# Patient Record
Sex: Male | Born: 1990 | Race: White | Hispanic: No | Marital: Single | Smoking: Never smoker
Health system: Southern US, Community
[De-identification: ages and names within clinical notes are randomized; demographics above are authoritative.]

## PROBLEM LIST (undated history)

## (undated) DIAGNOSIS — E785 Hyperlipidemia, unspecified: Secondary | ICD-10-CM

## (undated) DIAGNOSIS — R7303 Prediabetes: Secondary | ICD-10-CM

---
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
## 2011-10-19 ENCOUNTER — Emergency Department: Payer: Self-pay | Admitting: Emergency Medicine

## 2013-09-06 ENCOUNTER — Emergency Department: Payer: Self-pay | Admitting: Emergency Medicine

## 2013-10-22 ENCOUNTER — Emergency Department: Payer: Self-pay | Admitting: Emergency Medicine

## 2013-10-25 ENCOUNTER — Emergency Department: Payer: Self-pay | Admitting: Emergency Medicine

## 2013-10-25 LAB — CBC
HCT: 45 % (ref 40.0–52.0)
HGB: 15.1 g/dL (ref 13.0–18.0)
MCH: 28 pg (ref 26.0–34.0)
MCHC: 33.5 g/dL (ref 32.0–36.0)
Platelet: 227 10*3/uL (ref 150–440)
RBC: 5.39 10*6/uL (ref 4.40–5.90)
WBC: 11.3 10*3/uL — ABNORMAL HIGH (ref 3.8–10.6)

## 2013-10-25 LAB — BASIC METABOLIC PANEL
Anion Gap: 6 — ABNORMAL LOW (ref 7–16)
BUN: 19 mg/dL — ABNORMAL HIGH (ref 7–18)
Chloride: 104 mmol/L (ref 98–107)
Co2: 27 mmol/L (ref 21–32)
EGFR (Non-African Amer.): >60
Glucose: 95 mg/dL (ref 65–99)
Osmolality: 276 (ref 275–301)
Potassium: 3.4 mmol/L — ABNORMAL LOW (ref 3.5–5.1)
Sodium: 137 mmol/L (ref 136–145)

## 2013-11-01 LAB — COMPREHENSIVE METABOLIC PANEL
Albumin: 3.8 g/dL (ref 3.4–5.0)
Alkaline Phosphatase: 108 U/L
BUN: 13 mg/dL (ref 7–18)
Bilirubin,Total: 0.2 mg/dL (ref 0.2–1.0)
Calcium, Total: 9 mg/dL (ref 8.5–10.1)
Co2: 28 mmol/L (ref 21–32)
EGFR (African American): >60
EGFR (Non-African Amer.): >60
Osmolality: 278 (ref 275–301)
SGOT(AST): 29 U/L (ref 15–37)
SGPT (ALT): 36 U/L (ref 12–78)
Sodium: 139 mmol/L (ref 136–145)

## 2013-11-01 LAB — CBC WITH DIFFERENTIAL/PLATELET
Basophil #: 0.1 10*3/uL (ref 0.0–0.1)
Basophil %: 0.9 %
Eosinophil #: 0.5 10*3/uL (ref 0.0–0.7)
HCT: 46.5 % (ref 40.0–52.0)
HGB: 15.6 g/dL (ref 13.0–18.0)
Lymphocyte %: 26.4 %
MCV: 83 fL (ref 80–100)
Monocyte %: 9.8 %
Neutrophil #: 6.7 10*3/uL — ABNORMAL HIGH (ref 1.4–6.5)
Neutrophil %: 58.7 %
Platelet: 269 10*3/uL (ref 150–440)
RDW: 12.6 % (ref 11.5–14.5)

## 2013-11-01 LAB — URINALYSIS, COMPLETE
Glucose,UR: NEGATIVE mg/dL (ref 0–75)
Protein: 30
RBC,UR: 3 /HPF (ref 0–5)
Squamous Epithelial: NONE SEEN
WBC UR: 7 /HPF (ref 0–5)

## 2013-11-02 ENCOUNTER — Inpatient Hospital Stay: Payer: Self-pay | Admitting: Internal Medicine

## 2013-11-02 DIAGNOSIS — R0602 Shortness of breath: Secondary | ICD-10-CM

## 2013-11-02 LAB — PRO B NATRIURETIC PEPTIDE: B-Type Natriuretic Peptide: 137 pg/mL — ABNORMAL HIGH (ref 0–125)

## 2013-11-03 LAB — CBC WITH DIFFERENTIAL/PLATELET
Basophil %: 0.1 %
Eosinophil #: 0 10*3/uL (ref 0.0–0.7)
Eosinophil %: 0 %
HCT: 41.7 % (ref 40.0–52.0)
HGB: 14.1 g/dL (ref 13.0–18.0)
MCH: 28.4 pg (ref 26.0–34.0)
MCHC: 33.9 g/dL (ref 32.0–36.0)
MCV: 84 fL (ref 80–100)
Neutrophil #: 20.6 10*3/uL — ABNORMAL HIGH (ref 1.4–6.5)
Neutrophil %: 90.2 %
Platelet: 237 10*3/uL (ref 150–440)
RBC: 4.98 10*6/uL (ref 4.40–5.90)
RDW: 13.2 % (ref 11.5–14.5)
WBC: 22.9 10*3/uL — ABNORMAL HIGH (ref 3.8–10.6)

## 2013-11-03 LAB — BASIC METABOLIC PANEL
BUN: 11 mg/dL (ref 7–18)
Chloride: 108 mmol/L — ABNORMAL HIGH (ref 98–107)
Creatinine: 0.81 mg/dL (ref 0.60–1.30)
EGFR (African American): >60
EGFR (Non-African Amer.): >60
Potassium: 4 mmol/L (ref 3.5–5.1)

## 2013-11-03 LAB — MAGNESIUM: Magnesium: 2 mg/dL

## 2014-02-03 ENCOUNTER — Emergency Department: Payer: Self-pay | Admitting: Emergency Medicine

## 2014-12-03 ENCOUNTER — Emergency Department: Payer: Self-pay | Admitting: Internal Medicine

## 2015-03-02 NOTE — H&P (Signed)
PATIENT NAME:  Erik Tyler, Erik Tyler MR#:  [id number] DATE OF BIRTH:  Feb 12, 1991  DATE OF ADMISSION:  11/02/2013  REFERRING PHYSICIAN: Dr. Zenda Alpers.   PRIMARY CARE PHYSICIAN: None.  CHIEF COMPLAINT: Shortness of breath and wheezing.   HISTORY OF PRESENT ILLNESS: This is a very nice 24 year old gentleman who has history of being diagnosed with pneumonia 1-1/2 weeks ago here at the ER. He presented with a history of several days of cough and wheezing. He had a chest x-ray that showed significant changes that were likely secondary to infiltrate on the left base. The patient was given several medications and started having significant nausea, for which he came over and his medications were changed. He continued to take Levaquin, but his shortness of breath and cough continued to get worse. Apparently, he has been coughing all day up until the last two days he had a dry cough, now is productive with green sputum. The patient works for [employer] and he does work outside. He gets really winded with just minimal ambulation. If he picks up a box that could be 10 pounds or less, gets even more short of breath. Apparently, the patient has been breathing really hard, and has some soreness of the chest with deep breaths, especially in the retrosternal area. The patient comes to the Emergency Department. He is evaluated. He is given multiple nebulizations and he is not getting any better. He is breathing up to 30 minutes. His oxygen saturation dropped to 90 on room air and on the ABG we can see that he has a pO2 of 60. The patient was given one dose of Solu-Medrol and still has not had any significant improvement. The patient is admitted for evaluation of this symptoms. A CT scan of the chest was done to rule out pulmonary embolism and is negative for PE, only showed significant thickening peribronchial, which is unspecific at this moment, but it did not show any active consolidations. The patient says that he has  had fever at home, but he is afebrile over here.   REVIEW OF SYSTEMS:  CONSTITUTIONAL: No fever, as mentioned above. Positive fatigue. Positive weakness due to shortness of breath. No significant weight loss or weight gain.  EYES: No double vision, blurry vision or glaucoma.  EARS, NOSE, THROAT: No difficulty swallowing. No tinnitus.   RESPIRATORY: Positive cough. Positive wheezing. Positive dyspnea. Positive painful respirations. He used to smoke but he quit a couple of weeks ago. He never smoked more than 1 or 2 cigarettes a day and he did not smoke every day.  GASTROINTESTINAL: No nausea, vomiting. No abdominal pain, constipation or diarrhea.  GENITOURINARY: No dysuria or hematuria. No prostatitis.  ENDOCRINE: No polyuria, polydipsia, polyphagia, cold or heat intolerance.  SKIN: No rashes, petechiae, or new lesions.  LYMPHATIC: Negative for lymphadenopathy.  MUSCULOSKELETAL: Negative for significant joint abnormalities or joint swelling.  NEUROLOGIC: Cranial nerves II through XII are intact. No CVA, TIA, numbness or tingling.  PSYCHIATRIC: No significant insomnia or agitation.   PAST MEDICAL HISTORY: None. The patient is healthy. The patient is morbidly obese with BMI of 38.   ALLERGIES: No known drug allergies.   PAST SURGICAL HISTORY: None.   FAMILY HISTORY: His father had multiple episodes of bronchitis. He is not a smoker. He had a heart attack x 2. Positive diabetes in the family. No cancer in the family.   SOCIAL HISTORY: The patient works for [employer] as mentioned above. He has  started smoking at the  age of 24 and quit over two weeks ago. He states that he never smoked more than a couple of cigarettes a day and he never smoked every day. Six months ago he started transitioning from regular cigarettes to electronics cigarettes. He states that he drinks alcohol very rarely. He lives with his parents right now. He bought a house but he has not been able to move into it. He  is married. He has one year and a half year old.  He has one dog and 2 cats and he always had dogs and cats all his life.  MEDICATIONS:  Occasionally take something for headache, over the counter if necessary, and he is taking Levaquin 500 mg every 24 hours.   PHYSICAL EXAMINATION: VITAL SIGNS: Blood pressure of 140s over 70s. Respiratory rate up to 32, pulse 118, temperature 96.8, oxygen saturation down to 91% on room air.  GENERAL: The patient is alert and oriented x 3. The patient had some moderate respiratory distress especially when he talks he needs to stop. He talks only small sentences, takes a couple deep breaths and starts talking again. The patient is using accessory muscles whenever he does this and has mild distress again.  HEENT: His pupils are equal and reactive. Extraocular movements are intact. Mucosa are moist. Anicteric sclerae. Pink conjunctivae. No oral lesions. No oropharyngeal exudates.  NECK: Supple. No JVD. No thyromegaly. No adenopathy. No carotid bruits.  CARDIOVASCULAR: Regular rate and rhythm, tachycardic. No murmurs, rubs or gallops. No tenderness to palpation anterior chest wall.  LUNGS: Significant wheezing diffuse all throughout respiratory fields with decreased air entrance. Positive use of accessory muscles as mentioned above.  ABDOMEN: Obese, nontender, nondistended. No hepatosplenomegaly. No masses. Bowel sounds are positive.  EXTREMITIES: No edema, tenderness or clubbing.  GENITAL: Deferred.  VASCULAR: Pulses +2. Capillary refill less than 3.  SKIN: No rashes or petechiae.  LYMPHATIC: Negative for lymphadenopathy in the supraclavicular areas.  NEUROLOGIC: Cranial nerves II through XII intact. Strength is five out of five in all four extremities.  PSYCHIATRIC: No significant agitation. The patient is a little bit anxious due to his shortness of breath.   LABORATORY AND DIAGNOSTICS: CT scan of the chest to rule out PE is negative for that, only some thickening  of the bronchi wall. His glucose is 110, sodium 139, potassium 2.9, CO2 is 28. LFTs within normal limits. White count is 11.4, hemoglobin 15.6, platelet count 269. Blood cultures taken no growth so far. Urinalysis negative, pO2 is 60, pCO2 is 39.   Chest x-ray: No active cardiopulmonary disease on this one.   The patient does not have an EKG.  ASSESSMENT AND PLAN:  A 24 year old gentleman with severe shortness of breath and treated for pneumonia twice without  improvement of the symptoms.  1. Acute respiratory failure with moderate to severe respiratory distress. The patient is using accessory muscles having hypoxia with a  pO2 of 60 on ABGs. The patient has what appears to be acute bronchitis, although very severe. He has been treated with Levaquin without any significant improvement. We are going to change his antibiotics to IV antibiotics as the patient is severely ill right now and we are going to change for Rocephin and azithromycin. Azithromycin might have some good potential for an anti-inflammatory of the respiratory airway.  The patient had a CT scan, which is negative for pulmonary embolism, considered that the patient is morbidly obese, possibility of sleep apnea is there. The possibility of increased right cardiac pressures with  OSA and OHS. We are going to do an echocardiogram, but again, this is very likely to be secondary to chronic obstructive pulmonary disease and the patient is going to be covered with antibiotics and steroids are going to be started. start albuterol with Atrovent.  2. Systemic inflammatory response syndrome. The patient is tachycardic, tachypneic and hypoxic.  3. Broad-spectrum coverage with Rocephin and azithromycin.   Due to the presentation of the patient was severe. We are going to test him for Alpha 1  antitrypsin to evaluate the possibility of early beginning of chronic obstructive pulmonary disease. The patient is a FULL CODE.   TIME SPENT: I spent about 45  minutes with this patient.   Gastrointestinal prophylaxis with Protonix. Deep vein thrombosis prophylaxis with Lovenox.    ____________________________ Felipa Furnace, MD rsg:sg D: 11/02/2013 05:46:08 ET T: 11/02/2013 06:15:57 ET JOB#: [id number]  cc: Felipa Furnace, MD, <Dictator> Jaimie Pippins Juanda Chance MD ELECTRONICALLY SIGNED 11/07/2013 0:38

## 2015-03-03 NOTE — Discharge Summary (Signed)
PATIENT NAME:  Erik Tyler, Ames L MR#:  [id number] DATE OF BIRTH:  Jul 26, 1991  DATE OF ADMISSION:  11/02/2013 DATE OF DISCHARGE:  11/03/2013  DISCHARGE DIAGNOSES:  1.  Acute bronchitis.  2.  Sepsis.  3.  Acute respiratory failure.   IMAGING STUDIES DONE:  Include a chest x-ray and CT scan of the chest, which showed mild bronchitis. No acute abnormalities.    HISTORY AND PHYSICAL:  Please see detailed H and P dictated by Dr. Mordecai MaesSanchez. In brief, a 24 year old male patient brought in to the hospital complaining of shortness of breath, cough. He was found to have acute bronchitis, acute respiratory failure, needing oxygen to keep his sats over 90%, and was admitted to the hospitalist service with wheezing.     HOSPITAL COURSE:  The patient was started on IV steroids, IV antibiotics with ceftriaxone and azithromycin, with which he has improved well. The patient on the day of discharge is ambulating in the hallways, saturating 92% on room air, feels significantly better than before, and is being switched to prednisone oral taper, azithromycin for 6 more days, along with albuterol p.r.n., and is being discharged home. The patient had an alpha-1 antitrypsin checked, which was in the normal range.   Today on examination, the patient has mild wheezing, good air entry on both sides.   DISCHARGE MEDICATIONS:  Include:  1.  Guaifenesin 600 mg oral 2 times a day.  2.  Azithromycin 500 mg oral once a day.  3.  Prednisone 10 mg tablets, 60 mg tapered by 5 mg every day for 12 days.  4.  Ventolin HFA 2 puffs inhaled 4 times a day as needed for shortness of breath.   DISCHARGE INSTRUCTIONS:  Regular diet, regular consistency. Activity as tolerated, and follow up with primary care physician in 1 to 2 weeks.   Time spent on day of discharge in discharge activity was 35 minutes.    ____________________________ Molinda BailiffSrikar R. Tanaka Gillen, MD srs:ms D: 11/03/2013 14:26:52 ET T: 11/03/2013 18:33:28  ET JOB#: [id number]  cc: Wardell HeathSrikar R. Bernetha Anschutz, MD, <Dictator> Orie FishermanSRIKAR R Terresa Marlett MD ELECTRONICALLY SIGNED 11/17/2013 11:12

## 2016-03-13 ENCOUNTER — Encounter: Payer: Self-pay | Admitting: *Deleted

## 2016-03-13 ENCOUNTER — Emergency Department
Admission: EM | Admit: 2016-03-13 | Discharge: 2016-03-13 | Disposition: A | Discharge status: Home / Self Care | Payer: Self-pay | Attending: Emergency Medicine | Admitting: Emergency Medicine

## 2016-03-13 DIAGNOSIS — S20362A Insect bite (nonvenomous) of left front wall of thorax, initial encounter: Secondary | ICD-10-CM | POA: Insufficient documentation

## 2016-03-13 DIAGNOSIS — Y929 Unspecified place or not applicable: Secondary | ICD-10-CM | POA: Insufficient documentation

## 2016-03-13 DIAGNOSIS — W57XXXA Bitten or stung by nonvenomous insect and other nonvenomous arthropods, initial encounter: Secondary | ICD-10-CM | POA: Insufficient documentation

## 2016-03-13 DIAGNOSIS — Y939 Activity, unspecified: Secondary | ICD-10-CM | POA: Insufficient documentation

## 2016-03-13 DIAGNOSIS — Y999 Unspecified external cause status: Secondary | ICD-10-CM | POA: Insufficient documentation

## 2016-03-13 DIAGNOSIS — A6920 Lyme disease, unspecified: Secondary | ICD-10-CM | POA: Insufficient documentation

## 2016-03-13 DIAGNOSIS — S40862A Insect bite (nonvenomous) of left upper arm, initial encounter: Secondary | ICD-10-CM

## 2016-03-13 MED ORDER — DOXYCYCLINE HYCLATE 100 MG PO TABS: 100.0000 mg | ORAL_TABLET | Freq: Two times a day (BID) | ORAL | Status: DC

## 2016-03-13 MED ORDER — DOXYCYCLINE HYCLATE 100 MG PO TABS
100.0000 mg | ORAL_TABLET | Freq: Once | ORAL | Status: AC
  Administered 2016-03-13: 100 mg via ORAL
  Filled 2016-03-13: qty 1

## 2016-03-13 NOTE — ED Provider Notes (Signed)
[facility] Emergency Department Provider Note ____________________________________________  Time seen: 2239  I have reviewed the triage vital signs and the nursing notes.  HISTORY  Chief Complaint  Rash   HPI Erik Tyler is a 25 y.o. male complaining of a rash to the left side x 1 week following a tick bite two weeks ago. Patient states he removed the tick and noticed the rash 5 days later. Since then it has become more erythematous and bigger in diameter. He associates the rash with pain to the entire left side of his body, tenderness to touch and itching. He denies fever, congestion, sore throat, cough, vomiting, diarrhea or arthralgias. He notes he has headaches frequently but feels as though he had a more severe HA today that was accompanied by some dizziness. He attributes some of this to the nature of his work on [employer]. He reports his overall pain at 4/10 in triage.   History reviewed. No pertinent past medical history.  There are no active problems to display for this patient.   History reviewed. No pertinent past surgical history.  Current Outpatient Rx  Name  Route  Sig  Dispense  Refill  . doxycycline (VIBRA-TABS) 100 MG tablet   Oral   Take 1 tablet (100 mg total) by mouth 2 (two) times daily.   20 tablet   0     Allergies Review of patient's allergies indicates no known allergies.  History reviewed. No pertinent family history.  Social History Social History  Substance Use Topics  . Smoking status: Never Smoker   . Smokeless tobacco: None  . Alcohol Use: No    Review of Systems  Constitutional: Negative for fever. Eyes: Negative for visual changes. ENT: Negative for sore throat. Cardiovascular: Negative for chest pain. Respiratory: Negative for shortness of breath. Gastrointestinal: Negative for abdominal pain, vomiting and diarrhea. Musculoskeletal: Negative for back pain. Positive for left sided pain Skin:  Positive for rash on the left ribs with pruritis and tenderness. Neurological: Negative for focal weakness or numbness. Reports headache as above ____________________________________________  PHYSICAL EXAM:  VITAL SIGNS: ED Triage Vitals  Enc Vitals Group     BP 03/13/16 2219 117/58 mmHg     Pulse Rate 03/13/16 2219 86     Resp 03/13/16 2219 18     Temp 03/13/16 2219 98.2 F (36.8 C)     Temp Source 03/13/16 2219 Oral     SpO2 03/13/16 2219 100 %     Weight 03/13/16 2219 258 lb (117.028 kg)     Height 03/13/16 2219 5\' 6"  (1.676 m)     Head Cir --      Peak Flow --      Pain Score --      Pain Loc --      Pain Edu? --      Excl. in GC? --     Constitutional: Alert and oriented. Well appearing and in no distress. Head: Normocephalic and atraumatic. Hematological/Lymphatic/Immunological: No cervical lymphadenopathy. Respiratory: Normal respiratory effort. Musculoskeletal: Nontender with normal range of motion in all extremities.  Neurologic:  Normal gait without ataxia. Normal speech and language. No gross focal neurologic deficits are appreciated. Skin:  Skin is warm, dry and intact. Well-demarcated, circular, erythematous rash on the left ribs. Central clearing is noted surrounding the tick bite site.  Psychiatric: Mood and affect are normal. Patient exhibits appropriate insight and judgment. ______________________________________________________________________________________  INITIAL IMPRESSION / ASSESSMENT AND PLAN / ED COURSE 25 yo male  complaining of a rash on the left ribs x 1 week following a tick bite approximately two weeks ago. Clinical diagnosis consistent with possible early stage lyme disease. Patient discharged with education on tick bites and lyme disease as well as Doxycycline  tablets BID for 10 days.  ____________________________________________  FINAL CLINICAL IMPRESSION(S) / ED DIAGNOSES  Final diagnoses:  Tick bite of axillary region, left,  initial encounter  Early localized Lyme disease     Lissa Hoard, PA-C 03/15/16 1913  Arnaldo Natal, MD 03/24/16 [id number]

## 2016-03-13 NOTE — Discharge Instructions (Signed)
Tick Bite Information °Ticks are insects that attach themselves to the skin and draw blood for food. There are various types of ticks. Common types include wood ticks and deer ticks. Most ticks live in shrubs and grassy areas. Ticks can climb onto your body when you make contact with leaves or grass where the tick is waiting. The most common places on the body for ticks to attach themselves are the scalp, neck, armpits, waist, and groin. °Most tick bites are harmless, but sometimes ticks carry germs that cause diseases. These germs can be spread to a person during the tick's feeding process. The chance of a disease spreading through a tick bite depends on:  °· The type of tick. °· Time of year.   °· How long the tick is attached.   °· Geographic location.   °HOW CAN YOU PREVENT TICK BITES? °Take these steps to help prevent tick bites when you are outdoors: °· Wear protective clothing. Long sleeves and long pants are best.   °· Wear white clothes so you can see ticks more easily. °· Tuck your pant legs into your socks.   °· If walking on a trail, stay in the middle of the trail to avoid brushing against bushes. °· Avoid walking through areas with long grass.  °· Put insect repellent on all exposed skin and along boot tops, pant legs, and sleeve cuffs.   °· Check clothing, hair, and skin repeatedly and before going inside.   °· Brush off any ticks that are not attached. °· Take a shower or bath as soon as possible after being outdoors.    °WHAT IS THE PROPER WAY TO REMOVE A TICK? °Ticks should be removed as soon as possible to help prevent diseases caused by tick bites. °· If latex gloves are available, put them on before trying to remove a tick.   °· Using fine-point tweezers, grasp the tick as close to the skin as possible. You may also use curved forceps or a tick removal tool. Grasp the tick as close to its head as possible. Avoid grasping the tick on its body. °· Pull gently with steady upward pressure until the  tick lets go. Do not twist the tick or jerk it suddenly. This may break off the tick's head or mouth parts. °· Do not squeeze or crush the tick's body. This could force disease-carrying fluids from the tick into your body.   °· After the tick is removed, wash the bite area and your hands with soap and water or other disinfectant such as alcohol. °· Apply a small amount of antiseptic cream or ointment to the bite site.   °· Wash and disinfect any instruments that were used.   °Do not try to remove a tick by applying a hot match, petroleum jelly, or fingernail polish to the tick. These methods do not work and may increase the chances of disease being spread from the tick bite.  °WHEN SHOULD YOU SEEK MEDICAL CARE? °Contact your health care provider if you are unable to remove a tick from your skin or if a part of the tick breaks off and is stuck in the skin.  °After a tick bite, you need to be aware of signs and symptoms that could be related to diseases spread by ticks. Contact your health care provider if you develop any of the following in the days or weeks after the tick bite: °· Unexplained fever. °· Rash. A circular rash that appears days or weeks after the tick bite may indicate the possibility of Lyme disease. The rash may resemble   a target with a bull's-eye and may occur at a different part of your body than the tick bite. °· Redness and swelling in the area of the tick bite.   °· Tender, swollen lymph glands.   °· Diarrhea.   °· Weight loss.   °· Cough.   °· Fatigue.   °· Muscle, joint, or bone pain.   °· Abdominal pain.   °· Headache.   °· Lethargy or a change in your level of consciousness. °· Difficulty walking or moving your legs.   °· Numbness in the legs.   °· Paralysis. °· Shortness of breath.   °· Confusion.   °· Repeated vomiting.   °  °This information is not intended to replace advice given to you by your health care provider. Make sure you discuss any questions you have with your health care  provider. °  °Document Released: 10/24/2000 Document Revised: 11/17/2014 Document Reviewed: 04/06/2013 °Elsevier Interactive Patient Education ©2016 [employer] °Lyme Disease °Lyme disease is an infection that affects many parts of the body, including the skin, joints, and nervous system. °CAUSES °Lyme disease is caused by bacteria called Borrelia burgdorferi. °You can get Lyme disease by being bitten by an infected tick. The tick must be attached to your skin for at least 36 hours to transmit the infection. Deer often carry infected ticks. °RISK FACTORS °· Living in or visiting [place], the [place] states, or the [place]. °· Spending time in wooded or grassy areas. °· Being outdoors with exposed skin. °· Failing to remove a tick from your skin within 3-4 days. °SIGNS AND SYMPTOMS °· A round, red rash that comes out from the center of the tick bite. This is the first sign of infection. The center of the rash may be blood colored or have tiny blisters. °· Fatigue. °· Headache. °· Chills and fever. °· General achiness. °· Joint pain, often in the knee. °· Swollen lymph glands. °DIAGNOSIS °Lyme disease is diagnosed with a medical history, physical exam, and blood test. °TREATMENT °The main treatment is antibiotic medicine, usually taken by mouth. The length of treatment depends on how soon after a tick bite you begin taking the medicine. In some cases, treatment is necessary for several weeks. If the infection is severe, IV antibiotics may be necessary. °HOME CARE INSTRUCTIONS °· Take your antibiotic medicine as directed by your health care provider. Finish the antibiotic even if you start to feel better. °· You may take a probiotic in between doses of your antibiotic to help avoid stomach upset or diarrhea. °· Check with your health care provider before supplementing your treatment. Many alternative therapies have not been proven and may be harmful to you. °· Keep all follow-up visits as directed by  your health care provider. This is important. °PREVENTION °Reinfection is possible with another tick bite by an infected tick. Take these precautions to prevent an infection: °· Cover your skin with light-colored clothing when outdoors in the spring and summer months. °· Spray clothing and skin with bug spray. The spray should be 20-30% DEET. °· Avoided wooded, grassy, and shaded areas. °· Remove yard litter, brush, trash, and plants that attract deer and rodents. °· Check yourself for ticks when you come indoors. °· Wash clothing worn each day. °· Check your pets for ticks before they come inside. °· If you find a tick: °¨ Remove it with tweezers. °¨ Clean your hands and the bite area with rubbing alcohol or soap and water. °Pregnant women should take special care to avoid tick bites because the infection can be passed along   to the fetus. °SEEK MEDICAL CARE IF: °· You have symptoms after treatment. °· You have removed a tick and want to bring it to your health care provider for testing. °SEEK IMMEDIATE MEDICAL CARE IF: °· You have an irregular heartbeat. °· You have nerve pain. °· Your face feels numb. °MAKE SURE YOU: °· Understand these instructions. °· Will watch your condition. °· Will get help right away if you are not doing well or get worse. °  °This information is not intended to replace advice given to you by your health care provider. Make sure you discuss any questions you have with your health care provider. °  °Document Released: 02/02/2001 Document Revised: 11/17/2014 Document Reviewed: 03/14/2014 °Elsevier Interactive Patient Education ©2016 [employer] Inc. ° °

## 2016-03-13 NOTE — ED Notes (Signed)
Pt to ED with bulls eye rash to left side/chest area after removing tick from same location x 2 weeks ago. Pt denies any other s/s such as fevers, n/v/d. Vitals stable, NAD noted.

## 2016-03-13 NOTE — ED Notes (Signed)
Pt has bullseye rash to L rib cage approx 5" in diameter.  Pt reports pulling tic off.  Pt denies pain until palpation.

## 2016-03-27 DIAGNOSIS — J358 Other chronic diseases of tonsils and adenoids: Secondary | ICD-10-CM | POA: Insufficient documentation

## 2016-04-18 DIAGNOSIS — Z6841 Body Mass Index (BMI) 40.0 and over, adult: Secondary | ICD-10-CM

## 2017-07-17 ENCOUNTER — Emergency Department: Payer: Self-pay

## 2017-07-17 ENCOUNTER — Emergency Department
Admission: EM | Admit: 2017-07-17 | Discharge: 2017-07-17 | Disposition: A | Discharge status: Home / Self Care | Payer: Self-pay | Attending: Emergency Medicine | Admitting: Emergency Medicine

## 2017-07-17 ENCOUNTER — Encounter: Payer: Self-pay | Admitting: Emergency Medicine

## 2017-07-17 DIAGNOSIS — J069 Acute upper respiratory infection, unspecified: Secondary | ICD-10-CM | POA: Insufficient documentation

## 2017-07-17 HISTORY — DX: Prediabetes: R73.03

## 2017-07-17 HISTORY — DX: Hyperlipidemia, unspecified: E78.5

## 2017-07-17 MED ORDER — ALBUTEROL SULFATE HFA 108 (90 BASE) MCG/ACT IN AERS: 2.0000 | INHALATION_SPRAY | Freq: Four times a day (QID) | RESPIRATORY_TRACT | 2 refills | Status: DC | PRN

## 2017-07-17 MED ORDER — IPRATROPIUM-ALBUTEROL 0.5-2.5 (3) MG/3ML IN SOLN
3.0000 mL | Freq: Once | RESPIRATORY_TRACT | Status: AC
  Administered 2017-07-17: 3 mL via RESPIRATORY_TRACT
  Filled 2017-07-17: qty 3

## 2017-07-17 MED ORDER — HYDROCOD POLST-CPM POLST ER 10-8 MG/5ML PO SUER: 5.0000 mL | Freq: Two times a day (BID) | ORAL | 0 refills | Status: DC

## 2017-07-17 MED ORDER — IPRATROPIUM-ALBUTEROL 0.5-2.5 (3) MG/3ML IN SOLN
RESPIRATORY_TRACT | Status: AC
  Administered 2017-07-17: 3 mL via RESPIRATORY_TRACT
  Filled 2017-07-17: qty 3

## 2017-07-17 MED ORDER — IPRATROPIUM-ALBUTEROL 0.5-2.5 (3) MG/3ML IN SOLN
RESPIRATORY_TRACT | Status: AC
  Filled 2017-07-17: qty 3

## 2017-07-17 MED ORDER — AZITHROMYCIN 250 MG PO TABS: ORAL_TABLET | ORAL | 0 refills | Status: DC

## 2017-07-17 MED ORDER — HYDROCOD POLST-CPM POLST ER 10-8 MG/5ML PO SUER
5.0000 mL | Freq: Once | ORAL | Status: AC
  Administered 2017-07-17: 5 mL via ORAL

## 2017-07-17 MED ORDER — IPRATROPIUM-ALBUTEROL 0.5-2.5 (3) MG/3ML IN SOLN
3.0000 mL | Freq: Once | RESPIRATORY_TRACT | Status: AC
  Administered 2017-07-17: 3 mL via RESPIRATORY_TRACT

## 2017-07-17 NOTE — ED Provider Notes (Addendum)
[facility] Emergency Department Provider Note       Time seen: ----------------------------------------- 6:23 PM on 07/17/2017 -----------------------------------------     I have reviewed the triage vital signs and the nursing notes.   HISTORY   Chief Complaint Cough    HPI Erik Tyler is a 26 y.o. male who presents to the ED for cough, congestion and shortness of breath for the past several days is getting worse. Patient describes reductive cough with green sputum production. Patient states he has chest pain with coughing. He had a fever yesterday at home. He describes 8 on a 10 pain in his chest when he coughs. He does report a history of similar with pneumonia in the past.   Past Medical History:  Diagnosis Date  . Borderline hyperlipidemia   . Pre-diabetes     There are no active problems to display for this patient.   History reviewed. No pertinent surgical history.  Allergies Bee venom  Social History Social History  Substance Use Topics  . Smoking status: Never Smoker  . Smokeless tobacco: Never Used  . Alcohol use No    Review of Systems Constitutional: Negative for fever. Eyes: Negative for vision changes ENT:  Negative for congestion, sore throat Cardiovascular: positive for chest pain Respiratory: positive for shortness of breath and cough Gastrointestinal: Negative for abdominal pain, vomiting and diarrhea. Genitourinary: Negative for dysuria. Musculoskeletal: Negative for back pain. Skin: Negative for rash. Neurological: Negative for headaches, focal weakness or numbness.  All systems negative/normal/unremarkable except as stated in the HPI  ____________________________________________   PHYSICAL EXAM:  VITAL SIGNS: ED Triage Vitals  Enc Vitals Group     BP 07/17/17 1731 (!) 144/83     Pulse Rate 07/17/17 1731 (!) 110     Resp 07/17/17 1731 20     Temp 07/17/17 1731 99.6 F (37.6 C)     Temp  Source 07/17/17 1731 Oral     SpO2 07/17/17 1731 96 %     Weight 07/17/17 1732 285 lb (129.3 kg)     Height 07/17/17 1732  (1.702 m)     Head Circumference --      Peak Flow --      Pain Score 07/17/17 1730 8     Pain Loc --      Pain Edu? --      Excl. in GC? --     Constitutional: Alert and oriented. Well appearing and in no distress. Eyes: Conjunctivae are normal. Normal extraocular movements. ENT   Head: Normocephalic and atraumatic.   Nose: No congestion/rhinnorhea.   Mouth/Throat: Mucous membranes are moist.   Neck: No stridor. Cardiovascular: Normal rate, regular rhythm. No murmurs, rubs, or gallops. Respiratory: bilateral wheezing with mild rhonchi Gastrointestinal: Soft and nontender. Normal bowel sounds Musculoskeletal: Nontender with normal range of motion in extremities. No lower extremity tenderness nor edema. Neurologic:  Normal speech and language. No gross focal neurologic deficits are appreciated.  Skin:  Skin is warm, dry and intact. No rash noted. Psychiatric: Mood and affect are normal. Speech and behavior are normal.  ____________________________________________  ED COURSE:  Pertinent labs & imaging results that were available during my care of the patient were reviewed by me and considered in my medical decision making (see chart for details). Patient presents for cough and congestion with shortness of breath, we will assess with  imaging as indicated. He will receive a breathing treatment and reevaluation.   Procedures ____________________________________________   RADIOLOGY Images were  viewed by me  chest x-ray is normal  ____________________________________________  FINAL ASSESSMENT AND PLAN  upper respiratory infection   Plan: Patient's labs and imaging were dictated above. Patient had presented for upper respiratory infection with productive cough. He does have an extensive history of same. he was given DuoNeb nebs with  clearing of his lung sounds.He'll be discharged with albuterol and Tussionex and is stable for outpatient follow-up.he is advised to return for worsening symptoms.   Emily FilbertWilliams, Michaelanthony Kempton E, MD   Note: This note was generated in part or whole with voice recognition software. Voice recognition is usually quite accurate but there are transcription errors that can and very often do occur. I apologize for any typographical errors that were not detected and corrected.     Emily FilbertWilliams, Sarahanne Novakowski E, MD 07/17/17 Andres [id number]    Emily FilbertWilliams, Britanny Marksberry E, MD 07/17/17 [phone]

## 2017-07-17 NOTE — ED Notes (Signed)
Per Dr. Scotty CourtStafford no protocols at this time except for chest xray.

## 2017-07-17 NOTE — ED Triage Notes (Signed)
Pt to ED from home c/o cough, congestion, and SOB for a couple days that's getting worse.  Productive green and yellow cough.  Patient states chest pain with coughing.  Fever yesterday at home.  Patient also has bilateral ankle rash.  Expiratory wheeze noted to right side.

## 2017-07-17 NOTE — ED Notes (Signed)
ED Provider at bedside. 

## 2018-02-08 ENCOUNTER — Other Ambulatory Visit: Payer: Self-pay

## 2018-02-08 ENCOUNTER — Emergency Department
Admission: EM | Admit: 2018-02-08 | Discharge: 2018-02-09 | Disposition: A | Discharge status: Home / Self Care | Payer: Managed Care, Other (non HMO) | Attending: Emergency Medicine | Admitting: Emergency Medicine

## 2018-02-08 ENCOUNTER — Encounter: Payer: Self-pay | Admitting: Emergency Medicine

## 2018-02-08 DIAGNOSIS — J020 Streptococcal pharyngitis: Secondary | ICD-10-CM | POA: Diagnosis not present

## 2018-02-08 DIAGNOSIS — J029 Acute pharyngitis, unspecified: Secondary | ICD-10-CM | POA: Diagnosis present

## 2018-02-08 LAB — GROUP A STREP BY PCR: Group A Strep by PCR: DETECTED — AB

## 2018-02-08 MED ORDER — DEXAMETHASONE SODIUM PHOSPHATE 10 MG/ML IJ SOLN
10.0000 mg | Freq: Once | INTRAMUSCULAR | Status: AC
  Administered 2018-02-08: 10 mg via INTRAMUSCULAR
  Filled 2018-02-08: qty 1

## 2018-02-08 MED ORDER — AMOXICILLIN 500 MG PO CAPS
1000.0000 mg | ORAL_CAPSULE | Freq: Once | ORAL | Status: AC
  Administered 2018-02-08: 1000 mg via ORAL
  Filled 2018-02-08: qty 2

## 2018-02-08 MED ORDER — IBUPROFEN 800 MG PO TABS
800.0000 mg | ORAL_TABLET | Freq: Once | ORAL | Status: AC
  Administered 2018-02-08: 800 mg via ORAL
  Filled 2018-02-08: qty 1

## 2018-02-08 MED ORDER — IBUPROFEN 600 MG PO TABS: 600.0000 mg | ORAL_TABLET | Freq: Four times a day (QID) | ORAL | 0 refills | Status: DC | PRN

## 2018-02-08 MED ORDER — AMOXICILLIN 875 MG PO TABS: 875.0000 mg | ORAL_TABLET | Freq: Two times a day (BID) | ORAL | 0 refills | Status: DC

## 2018-02-08 NOTE — Discharge Instructions (Addendum)
Please take antibiotics as prescribed.  Alternate Tylenol and ibuprofen as needed for pain or fevers.  Make sure you are drinking lots of fluids.  If any trouble swallowing or fevers above 102.1 that are not going down Tylenol and ibuprofen please return to the emergency room.

## 2018-02-08 NOTE — ED Triage Notes (Signed)
PT reports that he has had a sore throat for the last week, he reports that he does have problems with his tonsils also. He is able to speak in complete sentences and swallow his own saliva.

## 2018-02-08 NOTE — ED Provider Notes (Signed)
[facility] EMERGENCY DEPARTMENT Provider Note   CSN: [id number] Arrival date & time: 02/08/18  2016     History   Chief Complaint Chief Complaint  Patient presents with  . Sore Throat    HPI Erik Tyler is a 27 y.o. male presents to the emergency department for evaluation of sore throat.  Sore throat has  been present for 7 days.  He has had severe sore throat pain today.  He is able to swallow fluids.  Denies any fevers.  Has had mild cough.  Denies any chest pain or shortness of breath.  No problems with opening his mouth no drooling.  HPI  Past Medical History:  Diagnosis Date  . Borderline hyperlipidemia   . Pre-diabetes     There are no active problems to display for this patient.   History reviewed. No pertinent surgical history.      Home Medications    Prior to Admission medications   Medication Sig Start Date End Date Taking? Authorizing Provider  albuterol (PROVENTIL HFA;VENTOLIN HFA) 108 (90 Base) MCG/ACT inhaler Inhale 2 puffs into the lungs every 6 (six) hours as needed for wheezing or shortness of breath. 07/17/17   Emily FilbertWilliams, Jonathan E, MD  amoxicillin (AMOXIL) 875 MG tablet Take 1 tablet (875 mg total) by mouth 2 (two) times daily. X 10 days 02/08/18   Evon SlackGaines, Kameryn Tisdel C, PA-C  azithromycin (ZITHROMAX Z-PAK) 250 MG tablet Take 2 tablets (500 mg) on  Day 1,  followed by 1 tablet (250 mg) once daily on Days 2 through 5. 07/17/17   Emily FilbertWilliams, Jonathan E, MD  chlorpheniramine-HYDROcodone (TUSSIONEX PENNKINETIC ER) 10-8 MG/5ML SUER Take 5 mLs by mouth 2 (two) times daily. 07/17/17   Emily FilbertWilliams, Jonathan E, MD  doxycycline (VIBRA-TABS) 100 MG tablet Take 1 tablet (100 mg total) by mouth 2 (two) times daily. 03/13/16   Menshew, Charlesetta IvoryJenise V Bacon, PA-C  ibuprofen (ADVIL,MOTRIN) 600 MG tablet Take 1 tablet (600 mg total) by mouth every 6 (six) hours as needed for moderate pain. 02/08/18   Evon SlackGaines, Nikeisha Klutz C, PA-C    Family History History reviewed. No  pertinent family history.  Social History Social History   Tobacco Use  . Smoking status: Never Smoker  . Smokeless tobacco: Never Used  Substance Use Topics  . Alcohol use: No  . Drug use: No     Allergies   Bee venom   Review of Systems Review of Systems  Constitutional: Negative for fever.  HENT: Positive for sore throat. Negative for congestion, rhinorrhea, sinus pressure and sinus pain.   Respiratory: Positive for cough. Negative for wheezing and stridor.   Gastrointestinal: Negative for diarrhea, nausea and vomiting.  Musculoskeletal: Positive for myalgias. Negative for arthralgias and neck stiffness.  Skin: Negative for rash.  Neurological: Negative for dizziness.     Physical Exam Updated Vital Signs BP (!) 145/84 (BP Location: Right Arm)   Pulse 88   Temp 98.6 F (37 C) (Oral)   Resp 20   Ht 5\' 9"  (1.753 m)   Wt 127 kg (280 lb)   SpO2 98%   BMI 41.35 kg/m   Physical Exam  Constitutional: He is oriented to person, place, and time. He appears well-developed and well-nourished. No distress.  HENT:  Head: Normocephalic and atraumatic.  Right Ear: Hearing, tympanic membrane, external ear and ear canal normal.  Left Ear: Hearing, tympanic membrane, external ear and ear canal normal.  Nose: Rhinorrhea present. No nasal septal hematoma. Right sinus exhibits  no maxillary sinus tenderness and no frontal sinus tenderness. Left sinus exhibits no maxillary sinus tenderness and no frontal sinus tenderness.  Mouth/Throat: Uvula is midline. No trismus in the jaw. No uvula swelling. Posterior oropharyngeal erythema present. No oropharyngeal exudate, posterior oropharyngeal edema or tonsillar abscesses.  Eyes: Conjunctivae are normal.  Neck: Normal range of motion.  Cardiovascular: Normal rate and regular rhythm.  Pulmonary/Chest: No stridor. No respiratory distress. He has no wheezes. He exhibits no tenderness.  Abdominal: Soft. He exhibits no distension. There is no  tenderness. There is no guarding.  Musculoskeletal: Normal range of motion.  Neurological: He is alert and oriented to person, place, and time.  Skin: Skin is warm and dry. No rash noted.  Psychiatric: He has a normal mood and affect. His behavior is normal. Judgment and thought content normal.     ED Treatments / Results  Labs (all labs ordered are listed, but only abnormal results are displayed) Labs Reviewed  GROUP A STREP BY PCR - Abnormal; Notable for the following components:      Result Value   Group A Strep by PCR DETECTED (*)    All other components within normal limits    EKG None  Radiology No results found.  Procedures Procedures (including critical care time)  Medications Ordered in ED Medications  dexamethasone (DECADRON) injection 10 mg (has no administration in time range)  ibuprofen (ADVIL,MOTRIN) tablet 800 mg (has no administration in time range)  amoxicillin (AMOXIL) capsule 1,000 mg (has no administration in time range)     Initial Impression / Assessment and Plan / ED Course  I have reviewed the triage vital signs and the nursing notes.  Pertinent labs & imaging results that were available during my care of the patient were reviewed by me and considered in my medical decision making (see chart for details).     27 year old male with positive strep pharyngitis noted on PCR.  He is started on amoxicillin.  He is given 10 mg of dexamethasone for severe sore throat pain.  No sign of peritonsillar abscess.  He will alternate Tylenol and ibuprofen as needed.  Patient is educated on signs symptoms return to ED for.  Final Clinical Impressions(s) / ED Diagnoses   Final diagnoses:  Strep pharyngitis    ED Discharge Orders        Ordered    amoxicillin (AMOXIL) 875 MG tablet  2 times daily     02/08/18 2330    ibuprofen (ADVIL,MOTRIN) 600 MG tablet  Every 6 hours PRN     02/08/18 2330       Evon Slack, PA-C 02/08/18 2336    Phineas Semen, MD 02/08/18 2355

## 2018-02-09 MED ORDER — DEXAMETHASONE SODIUM PHOSPHATE 10 MG/ML IJ SOLN
INTRAMUSCULAR | Status: DC
Start: 2018-02-09 — End: 2018-02-09
  Filled 2018-02-09: qty 1

## 2018-05-04 ENCOUNTER — Emergency Department: Payer: Managed Care, Other (non HMO)

## 2018-05-04 ENCOUNTER — Emergency Department
Admission: EM | Admit: 2018-05-04 | Discharge: 2018-05-04 | Disposition: A | Discharge status: Home / Self Care | Payer: Managed Care, Other (non HMO) | Attending: Emergency Medicine | Admitting: Emergency Medicine

## 2018-05-04 ENCOUNTER — Other Ambulatory Visit: Payer: Self-pay

## 2018-05-04 ENCOUNTER — Encounter: Payer: Self-pay | Admitting: Emergency Medicine

## 2018-05-04 DIAGNOSIS — K3580 Unspecified acute appendicitis: Secondary | ICD-10-CM

## 2018-05-04 DIAGNOSIS — R109 Unspecified abdominal pain: Secondary | ICD-10-CM | POA: Diagnosis present

## 2018-05-04 LAB — COMPREHENSIVE METABOLIC PANEL WITH GFR
ALT: 29 U/L (ref 0–44)
AST: 26 U/L (ref 15–41)
Albumin: 4.6 g/dL (ref 3.5–5.0)
Alkaline Phosphatase: 75 U/L (ref 38–126)
Anion gap: 7 (ref 5–15)
BUN: 15 mg/dL (ref 6–20)
CO2: 28 mmol/L (ref 22–32)
Calcium: 9.2 mg/dL (ref 8.9–10.3)
Chloride: 100 mmol/L (ref 98–111)
Creatinine, Ser: 0.74 mg/dL (ref 0.61–1.24)
GFR calc Af Amer: >60 mL/min
GFR calc non Af Amer: >60 mL/min
Glucose, Bld: 111 mg/dL — ABNORMAL HIGH (ref 70–99)
Potassium: 4 mmol/L (ref 3.5–5.1)
Sodium: 135 mmol/L (ref 135–145)
Total Bilirubin: 0.9 mg/dL (ref 0.3–1.2)
Total Protein: 8.1 g/dL (ref 6.5–8.1)

## 2018-05-04 LAB — URINALYSIS, COMPLETE (UACMP) WITH MICROSCOPIC
Bacteria, UA: NONE SEEN
Bilirubin Urine: NEGATIVE
Glucose, UA: NEGATIVE mg/dL
Hgb urine dipstick: NEGATIVE
Ketones, ur: NEGATIVE mg/dL
Leukocytes, UA: NEGATIVE
Nitrite: NEGATIVE
Protein, ur: NEGATIVE mg/dL
Specific Gravity, Urine: 1.019 (ref 1.005–1.030)
Squamous Epithelial / HPF: NONE SEEN (ref 0–5)
WBC, UA: NONE SEEN WBC/hpf (ref 0–5)
pH: 8 (ref 5.0–8.0)

## 2018-05-04 LAB — CBC
HCT: 46.2 % (ref 40.0–52.0)
Hemoglobin: 16 g/dL (ref 13.0–18.0)
MCH: 28.9 pg (ref 26.0–34.0)
MCHC: 34.6 g/dL (ref 32.0–36.0)
MCV: 83.6 fL (ref 80.0–100.0)
Platelets: 242 K/uL (ref 150–440)
RBC: 5.52 MIL/uL (ref 4.40–5.90)
RDW: 13 % (ref 11.5–14.5)
WBC: 10.6 K/uL (ref 3.8–10.6)

## 2018-05-04 LAB — LIPASE, BLOOD: Lipase: 32 U/L (ref 11–51)

## 2018-05-04 MED ORDER — OXYCODONE-ACETAMINOPHEN 5-325 MG PO TABS
2.0000 | ORAL_TABLET | Freq: Once | ORAL | Status: AC
  Administered 2018-05-04: 2 via ORAL
  Filled 2018-05-04: qty 2

## 2018-05-04 MED ORDER — AMOXICILLIN-POT CLAVULANATE 875-125 MG PO TABS: 1.0000 | ORAL_TABLET | Freq: Two times a day (BID) | ORAL | 0 refills | Status: DC

## 2018-05-04 MED ORDER — ONDANSETRON 4 MG PO TBDP
4.0000 mg | ORAL_TABLET | Freq: Once | ORAL | Status: AC
  Administered 2018-05-04: 4 mg via ORAL
  Filled 2018-05-04: qty 1

## 2018-05-04 MED ORDER — OXYCODONE-ACETAMINOPHEN 5-325 MG PO TABS: 1.0000 | ORAL_TABLET | Freq: Three times a day (TID) | ORAL | 0 refills | Status: DC | PRN

## 2018-05-04 NOTE — ED Notes (Signed)
Patient transported to Ultrasound 

## 2018-05-04 NOTE — ED Provider Notes (Signed)
[facility] Emergency Department Provider Note       Time seen: ----------------------------------------- 9:40 AM on 05/04/2018 -----------------------------------------   I have reviewed the triage vital signs and the nursing notes.  HISTORY   Chief Complaint Abdominal Pain    HPI Erik Tyler is a 27 y.o. male with a history of DM and hyperlipidemia who presents to the ED for right-sided abdominal pain.  Patient states he had acute onset of pain yesterday.  He describes it as both sharp and dull and it increases with breathing.  Patient states she was unable to get comfortable last night due to the pain.  He has had difficulty eating for the pain and poor appetite.  He denies fevers, chills or other complaints.  Past Medical History:  Diagnosis Date  . Borderline hyperlipidemia   . Pre-diabetes     There are no active problems to display for this patient.   History reviewed. No pertinent surgical history.  Allergies Bee venom and Wasp venom  Social History Social History   Tobacco Use  . Smoking status: Never Smoker  . Smokeless tobacco: Never Used  Substance Use Topics  . Alcohol use: No  . Drug use: No   Review of Systems Constitutional: Negative for fever. Cardiovascular: Negative for chest pain. Respiratory: Negative for shortness of breath. Gastrointestinal: Positive for abdominal pain Musculoskeletal: Negative for back pain. Skin: Negative for rash. Neurological: Negative for headaches, focal weakness or numbness.  All systems negative/normal/unremarkable except as stated in the HPI  ____________________________________________   PHYSICAL EXAM:  VITAL SIGNS: ED Triage Vitals  Enc Vitals Group     BP 05/04/18 0905 131/90     Pulse Rate 05/04/18 0905 71     Resp 05/04/18 0905 20     Temp 05/04/18 0905 98.3 F (36.8 C)     Temp Source 05/04/18 0905 Oral     SpO2 05/04/18 0905 97 %     Weight 05/04/18 0905 289  lb (131.1 kg)     Height 05/04/18 0905 5\' 7"  (1.702 m)     Head Circumference --      Peak Flow --      Pain Score 05/04/18 0908 8     Pain Loc --      Pain Edu? --      Excl. in GC? --    Constitutional: Alert and oriented. Well appearing and in no distress. Eyes: Conjunctivae are normal. Normal extraocular movements. ENT   Head: Normocephalic and atraumatic.   Nose: No congestion/rhinnorhea.   Mouth/Throat: Mucous membranes are moist.   Neck: No stridor. Cardiovascular: Normal rate, regular rhythm. No murmurs, rubs, or gallops. Respiratory: Normal respiratory effort without tachypnea nor retractions. Breath sounds are clear and equal bilaterally. No wheezes/rales/rhonchi. Gastrointestinal: Right upper quadrant and right flank tenderness, no rebound or guarding.  Normal bowel sounds. Musculoskeletal: Nontender with normal range of motion in extremities. No lower extremity tenderness nor edema. Neurologic:  Normal speech and language. No gross focal neurologic deficits are appreciated.  Skin:  Skin is warm, dry and intact. No rash noted. Psychiatric: Mood and affect are normal. Speech and behavior are normal.  ____________________________________________  ED COURSE:  As part of my medical decision making, I reviewed the following data within the electronic MEDICAL RECORD NUMBER History obtained from family if available, nursing notes, old chart and ekg, as well as notes from prior ED visits. Patient presented for abdominal pain, we will assess with labs and imaging as indicated at  this time.   Procedures ____________________________________________   LABS (pertinent positives/negatives)  Labs Reviewed  COMPREHENSIVE METABOLIC PANEL - Abnormal; Notable for the following components:      Result Value   Glucose, Bld 111 (*)    All other components within normal limits  URINALYSIS, COMPLETE (UACMP) WITH MICROSCOPIC - Abnormal; Notable for the following components:   Color,  Urine YELLOW (*)    APPearance CLEAR (*)    All other components within normal limits  LIPASE, BLOOD  CBC    RADIOLOGY Images were viewed by me  Right upper quadrant ultrasound IMPRESSION: No gallstones or sonographic evidence of acute cholecystitis. Normal common bile duct.  Mildly increased hepatic echotexture compatible with fatty infiltrative change. CT renal protocol IMPRESSION: 1. The appendix is dilated with faint periappendiceal indistinctness consistent with developing acute appendicitis. No complicating features are currently seen. 2. No renal or ureteral calculi are noted.  No hydronephrosis.  ____________________________________________  DIFFERENTIAL DIAGNOSIS   Biliary colic, renal colic, cholecystitis, GERD, peptic ulcer disease  FINAL ASSESSMENT AND PLAN  Appendicitis   Plan: The patient had presented for right upper quadrant and right flank pain. Patient's labs are unremarkable. Patient's imaging was suggestive of acute appendicitis.  He does have right-sided abdominal tenderness with no other explanation.  He does not meet criteria for acute appendectomy.  We will try him on antibiotics and pain medicine with follow-up in 48 hours for recheck.  Patient is agreeable to this plan.   Ulice Dash, MD   Note: This note was generated in part or whole with voice recognition software. Voice recognition is usually quite accurate but there are transcription errors that can and very often do occur. I apologize for any typographical errors that were not detected and corrected.     Emily Filbert, MD 05/04/18 [phone]

## 2018-05-04 NOTE — ED Notes (Signed)
Patient transported to CT 

## 2018-05-04 NOTE — ED Triage Notes (Signed)
Right side abd pain since yesterday with dull pain that increases with breathing.  Says he was in a ball all night due to pain.

## 2018-05-06 ENCOUNTER — Encounter
Admission: EM | Disposition: A | Discharge status: Home / Self Care | Payer: Self-pay | Source: Home / Self Care | Attending: Emergency Medicine

## 2018-05-06 ENCOUNTER — Observation Stay: Payer: Managed Care, Other (non HMO) | Admitting: Anesthesiology

## 2018-05-06 ENCOUNTER — Observation Stay
Admission: EM | Admit: 2018-05-06 | Discharge: 2018-05-06 | Disposition: A | Discharge status: Home / Self Care | Payer: Managed Care, Other (non HMO) | Attending: Surgery | Admitting: Surgery

## 2018-05-06 ENCOUNTER — Emergency Department: Payer: Managed Care, Other (non HMO)

## 2018-05-06 ENCOUNTER — Other Ambulatory Visit: Payer: Self-pay

## 2018-05-06 DIAGNOSIS — E785 Hyperlipidemia, unspecified: Secondary | ICD-10-CM | POA: Diagnosis not present

## 2018-05-06 DIAGNOSIS — Z91038 Other insect allergy status: Secondary | ICD-10-CM | POA: Insufficient documentation

## 2018-05-06 DIAGNOSIS — K3531 Acute appendicitis with localized peritonitis and gangrene, without perforation: Principal | ICD-10-CM | POA: Insufficient documentation

## 2018-05-06 DIAGNOSIS — Z91030 Bee allergy status: Secondary | ICD-10-CM | POA: Diagnosis not present

## 2018-05-06 DIAGNOSIS — K3580 Unspecified acute appendicitis: Secondary | ICD-10-CM | POA: Diagnosis not present

## 2018-05-06 DIAGNOSIS — R7303 Prediabetes: Secondary | ICD-10-CM | POA: Diagnosis not present

## 2018-05-06 HISTORY — PX: LAPAROSCOPIC APPENDECTOMY: SHX408

## 2018-05-06 LAB — COMPREHENSIVE METABOLIC PANEL
ALT: 22 U/L (ref 0–44)
ANION GAP: 9 (ref 5–15)
AST: 22 U/L (ref 15–41)
Albumin: 4.3 g/dL (ref 3.5–5.0)
Alkaline Phosphatase: 69 U/L (ref 38–126)
BUN: 18 mg/dL (ref 6–20)
CHLORIDE: 100 mmol/L (ref 98–111)
CO2: 27 mmol/L (ref 22–32)
CREATININE: 1.01 mg/dL (ref 0.61–1.24)
Calcium: 9.1 mg/dL (ref 8.9–10.3)
Glucose, Bld: 120 mg/dL — ABNORMAL HIGH (ref 70–99)
Potassium: 3.8 mmol/L (ref 3.5–5.1)
SODIUM: 136 mmol/L (ref 135–145)
Total Bilirubin: 0.7 mg/dL (ref 0.3–1.2)
Total Protein: 8 g/dL (ref 6.5–8.1)

## 2018-05-06 LAB — CBC WITH DIFFERENTIAL/PLATELET
Basophils Absolute: 0.1 10*3/uL (ref 0–0.1)
Basophils Relative: 1 %
EOS ABS: 0.1 10*3/uL (ref 0–0.7)
Eosinophils Relative: 1 %
HCT: 45 % (ref 40.0–52.0)
HEMOGLOBIN: 15.5 g/dL (ref 13.0–18.0)
LYMPHS ABS: 1.9 10*3/uL (ref 1.0–3.6)
LYMPHS PCT: 18 %
MCH: 28.6 pg (ref 26.0–34.0)
MCHC: 34.5 g/dL (ref 32.0–36.0)
MCV: 83 fL (ref 80.0–100.0)
Monocytes Absolute: 1.1 10*3/uL — ABNORMAL HIGH (ref 0.2–1.0)
Monocytes Relative: 10 %
Neutro Abs: 8 10*3/uL — ABNORMAL HIGH (ref 1.4–6.5)
Neutrophils Relative %: 72 %
Platelets: 230 10*3/uL (ref 150–440)
RBC: 5.41 MIL/uL (ref 4.40–5.90)
RDW: 13.1 % (ref 11.5–14.5)
WBC: 11.1 10*3/uL — AB (ref 3.8–10.6)

## 2018-05-06 SURGERY — APPENDECTOMY, LAPAROSCOPIC
Anesthesia: General | Site: Abdomen | Wound class: Clean Contaminated

## 2018-05-06 MED ORDER — ONDANSETRON HCL 4 MG/2ML IJ SOLN: 4.0000 mg | Freq: Four times a day (QID) | INTRAMUSCULAR | Status: DC | PRN

## 2018-05-06 MED ORDER — LACTATED RINGERS IV SOLN
INTRAVENOUS | Status: DC | PRN
  Administered 2018-05-06: 06:00:00 via INTRAVENOUS

## 2018-05-06 MED ORDER — SUGAMMADEX SODIUM 500 MG/5ML IV SOLN
INTRAVENOUS | Status: AC
  Filled 2018-05-06: qty 5

## 2018-05-06 MED ORDER — HYDROMORPHONE HCL 1 MG/ML IJ SOLN
0.2500 mg | INTRAMUSCULAR | Status: DC | PRN
  Administered 2018-05-06 (×2): 0.25 mg via INTRAVENOUS

## 2018-05-06 MED ORDER — ONDANSETRON 4 MG PO TBDP: 4.0000 mg | ORAL_TABLET | Freq: Four times a day (QID) | ORAL | Status: DC | PRN

## 2018-05-06 MED ORDER — KETOROLAC TROMETHAMINE 30 MG/ML IJ SOLN
INTRAMUSCULAR | Status: DC | PRN
  Administered 2018-05-06: 30 mg via INTRAVENOUS

## 2018-05-06 MED ORDER — HYDROMORPHONE HCL 1 MG/ML IJ SOLN
INTRAMUSCULAR | Status: AC
  Administered 2018-05-06: 11:00:00
  Filled 2018-05-06: qty 1

## 2018-05-06 MED ORDER — IOPAMIDOL (ISOVUE-300) INJECTION 61%
125.0000 mL | Freq: Once | INTRAVENOUS | Status: AC | PRN
  Administered 2018-05-06: 125 mL via INTRAVENOUS

## 2018-05-06 MED ORDER — BUPIVACAINE HCL (PF) 0.5 % IJ SOLN
INTRAMUSCULAR | Status: AC
  Filled 2018-05-06: qty 30

## 2018-05-06 MED ORDER — ONDANSETRON HCL 4 MG/2ML IJ SOLN
INTRAMUSCULAR | Status: DC | PRN
  Administered 2018-05-06: 4 mg via INTRAVENOUS

## 2018-05-06 MED ORDER — LIDOCAINE HCL (PF) 1 % IJ SOLN
INTRAMUSCULAR | Status: AC
  Filled 2018-05-06: qty 30

## 2018-05-06 MED ORDER — ONDANSETRON HCL 4 MG/2ML IJ SOLN
4.0000 mg | Freq: Once | INTRAMUSCULAR | Status: AC
  Administered 2018-05-06: 4 mg via INTRAVENOUS
  Filled 2018-05-06: qty 2

## 2018-05-06 MED ORDER — TRAMADOL HCL 50 MG PO TABS: 50.0000 mg | ORAL_TABLET | Freq: Four times a day (QID) | ORAL | 0 refills | Status: DC | PRN

## 2018-05-06 MED ORDER — TRAMADOL HCL 50 MG PO TABS
50.0000 mg | ORAL_TABLET | Freq: Four times a day (QID) | ORAL | Status: DC | PRN
  Administered 2018-05-06: 50 mg via ORAL
  Filled 2018-05-06: qty 1

## 2018-05-06 MED ORDER — PROPOFOL 10 MG/ML IV BOLUS
INTRAVENOUS | Status: DC | PRN
  Administered 2018-05-06: 200 mg via INTRAVENOUS

## 2018-05-06 MED ORDER — FENTANYL CITRATE (PF) 100 MCG/2ML IJ SOLN
INTRAMUSCULAR | Status: AC
  Filled 2018-05-06: qty 2

## 2018-05-06 MED ORDER — GLYCOPYRROLATE 0.2 MG/ML IJ SOLN
INTRAMUSCULAR | Status: AC
  Filled 2018-05-06: qty 1

## 2018-05-06 MED ORDER — SUCCINYLCHOLINE CHLORIDE 20 MG/ML IJ SOLN
INTRAMUSCULAR | Status: DC | PRN
  Administered 2018-05-06: 140 mg via INTRAVENOUS

## 2018-05-06 MED ORDER — MIDAZOLAM HCL 2 MG/2ML IJ SOLN
INTRAMUSCULAR | Status: AC
  Filled 2018-05-06: qty 2

## 2018-05-06 MED ORDER — DEXAMETHASONE SODIUM PHOSPHATE 10 MG/ML IJ SOLN
INTRAMUSCULAR | Status: DC | PRN
  Administered 2018-05-06: 8 mg via INTRAVENOUS

## 2018-05-06 MED ORDER — KETOROLAC TROMETHAMINE 30 MG/ML IJ SOLN
INTRAMUSCULAR | Status: AC
  Filled 2018-05-06: qty 1

## 2018-05-06 MED ORDER — FENTANYL CITRATE (PF) 100 MCG/2ML IJ SOLN
INTRAMUSCULAR | Status: DC | PRN
  Administered 2018-05-06 (×3): 50 ug via INTRAVENOUS

## 2018-05-06 MED ORDER — ACETAMINOPHEN 10 MG/ML IV SOLN
INTRAVENOUS | Status: AC
  Filled 2018-05-06: qty 100

## 2018-05-06 MED ORDER — LACTATED RINGERS IV SOLN
125.0000 mL/h | INTRAVENOUS | Status: DC
  Administered 2018-05-06: 125 mL/h via INTRAVENOUS

## 2018-05-06 MED ORDER — SUGAMMADEX SODIUM 500 MG/5ML IV SOLN
INTRAVENOUS | Status: DC | PRN
  Administered 2018-05-06: 260 mg via INTRAVENOUS

## 2018-05-06 MED ORDER — MIDAZOLAM HCL 2 MG/2ML IJ SOLN
INTRAMUSCULAR | Status: DC | PRN
  Administered 2018-05-06: 2 mg via INTRAVENOUS

## 2018-05-06 MED ORDER — METRONIDAZOLE IN NACL 5-0.79 MG/ML-% IV SOLN
500.0000 mg | Freq: Once | INTRAVENOUS | Status: AC
  Administered 2018-05-06: 500 mg via INTRAVENOUS
  Filled 2018-05-06: qty 100

## 2018-05-06 MED ORDER — LACTATED RINGERS IV SOLN
INTRAVENOUS | Status: DC
  Administered 2018-05-06: 13:00:00 via INTRAVENOUS

## 2018-05-06 MED ORDER — ACETAMINOPHEN 325 MG PO TABS: 650.0000 mg | ORAL_TABLET | Freq: Four times a day (QID) | ORAL | Status: DC | PRN

## 2018-05-06 MED ORDER — DEXAMETHASONE SODIUM PHOSPHATE 10 MG/ML IJ SOLN
INTRAMUSCULAR | Status: AC
  Filled 2018-05-06: qty 1

## 2018-05-06 MED ORDER — LIDOCAINE HCL 1 % IJ SOLN
INTRAMUSCULAR | Status: DC | PRN
  Administered 2018-05-06: 20 mL via SUBCUTANEOUS

## 2018-05-06 MED ORDER — ENOXAPARIN SODIUM 40 MG/0.4ML ~~LOC~~ SOLN: 40.0000 mg | SUBCUTANEOUS | Status: DC

## 2018-05-06 MED ORDER — PROPOFOL 10 MG/ML IV BOLUS
INTRAVENOUS | Status: AC
  Filled 2018-05-06: qty 40

## 2018-05-06 MED ORDER — KETOROLAC TROMETHAMINE 30 MG/ML IJ SOLN: 30.0000 mg | Freq: Four times a day (QID) | INTRAMUSCULAR | Status: DC

## 2018-05-06 MED ORDER — HYDROMORPHONE HCL 1 MG/ML IJ SOLN: 0.5000 mg | INTRAMUSCULAR | Status: DC | PRN

## 2018-05-06 MED ORDER — PHENYLEPHRINE HCL 10 MG/ML IJ SOLN
INTRAMUSCULAR | Status: AC
  Filled 2018-05-06: qty 1

## 2018-05-06 MED ORDER — HYDROCODONE-ACETAMINOPHEN 7.5-325 MG PO TABS: 1.0000 | ORAL_TABLET | Freq: Once | ORAL | Status: DC | PRN

## 2018-05-06 MED ORDER — SODIUM CHLORIDE 0.9 % IV SOLN
1.0000 g | Freq: Once | INTRAVENOUS | Status: AC
  Administered 2018-05-06: 1 g via INTRAVENOUS
  Filled 2018-05-06: qty 10

## 2018-05-06 MED ORDER — SODIUM CHLORIDE 0.9 % IV BOLUS
1000.0000 mL | Freq: Once | INTRAVENOUS | Status: AC
  Administered 2018-05-06: 1000 mL via INTRAVENOUS

## 2018-05-06 MED ORDER — ACETAMINOPHEN 650 MG RE SUPP: 650.0000 mg | Freq: Four times a day (QID) | RECTAL | Status: DC | PRN

## 2018-05-06 MED ORDER — EPHEDRINE SULFATE 50 MG/ML IJ SOLN
INTRAMUSCULAR | Status: AC
  Filled 2018-05-06: qty 1

## 2018-05-06 MED ORDER — ACETAMINOPHEN 10 MG/ML IV SOLN
INTRAVENOUS | Status: DC | PRN
  Administered 2018-05-06: 1000 mg via INTRAVENOUS

## 2018-05-06 MED ORDER — ROCURONIUM BROMIDE 50 MG/5ML IV SOLN
INTRAVENOUS | Status: AC
Start: 2018-05-06 — End: ?
  Filled 2018-05-06: qty 1

## 2018-05-06 MED ORDER — FENTANYL CITRATE (PF) 100 MCG/2ML IJ SOLN
INTRAMUSCULAR | Status: AC
Start: 2018-05-06 — End: ?
  Filled 2018-05-06: qty 2

## 2018-05-06 MED ORDER — MORPHINE SULFATE (PF) 4 MG/ML IV SOLN
6.0000 mg | Freq: Once | INTRAVENOUS | Status: AC
  Administered 2018-05-06: 6 mg via INTRAVENOUS
  Filled 2018-05-06: qty 2

## 2018-05-06 MED ORDER — ACETAMINOPHEN 325 MG PO TABS: 325.0000 mg | ORAL_TABLET | ORAL | Status: DC | PRN

## 2018-05-06 MED ORDER — LIDOCAINE HCL (PF) 2 % IJ SOLN
INTRAMUSCULAR | Status: AC
  Filled 2018-05-06: qty 10

## 2018-05-06 MED ORDER — KETOROLAC TROMETHAMINE 30 MG/ML IJ SOLN
30.0000 mg | Freq: Four times a day (QID) | INTRAMUSCULAR | Status: DC
  Administered 2018-05-06: 30 mg via INTRAVENOUS
  Filled 2018-05-06: qty 1

## 2018-05-06 MED ORDER — FAMOTIDINE IN NACL 20-0.9 MG/50ML-% IV SOLN: 20.0000 mg | Freq: Two times a day (BID) | INTRAVENOUS | Status: DC

## 2018-05-06 MED ORDER — MORPHINE SULFATE (PF) 2 MG/ML IV SOLN: 2.0000 mg | INTRAVENOUS | Status: DC | PRN

## 2018-05-06 MED ORDER — ONDANSETRON HCL 4 MG/2ML IJ SOLN
INTRAMUSCULAR | Status: AC
  Filled 2018-05-06: qty 2

## 2018-05-06 MED ORDER — MEPERIDINE HCL 50 MG/ML IJ SOLN: 6.2500 mg | INTRAMUSCULAR | Status: DC | PRN

## 2018-05-06 MED ORDER — ROCURONIUM BROMIDE 100 MG/10ML IV SOLN
INTRAVENOUS | Status: DC | PRN
  Administered 2018-05-06: 35 mg via INTRAVENOUS
  Administered 2018-05-06: 10 mg via INTRAVENOUS

## 2018-05-06 MED ORDER — SUCCINYLCHOLINE CHLORIDE 20 MG/ML IJ SOLN
INTRAMUSCULAR | Status: AC
  Filled 2018-05-06: qty 1

## 2018-05-06 MED ORDER — LIDOCAINE HCL (CARDIAC) PF 100 MG/5ML IV SOSY
PREFILLED_SYRINGE | INTRAVENOUS | Status: DC | PRN
  Administered 2018-05-06: 100 mg via INTRAVENOUS

## 2018-05-06 MED ORDER — PROMETHAZINE HCL 25 MG/ML IJ SOLN: 6.2500 mg | INTRAMUSCULAR | Status: DC | PRN

## 2018-05-06 MED ORDER — ACETAMINOPHEN 160 MG/5ML PO SOLN
325.0000 mg | ORAL | Status: DC | PRN
  Filled 2018-05-06: qty 20.3

## 2018-05-06 MED ORDER — OXYCODONE-ACETAMINOPHEN 5-325 MG PO TABS: 1.0000 | ORAL_TABLET | ORAL | Status: DC | PRN

## 2018-05-06 SURGICAL SUPPLY — 39 items
BLADE SURG SZ11 CARB STEEL (BLADE) ×3 IMPLANT
CANISTER SUCT 3000ML PPV (MISCELLANEOUS) ×3 IMPLANT
CHLORAPREP W/TINT 26ML (MISCELLANEOUS) ×3 IMPLANT
CUTTER FLEX LINEAR 45M (STAPLE) ×3 IMPLANT
DECANTER SPIKE VIAL GLASS SM (MISCELLANEOUS) ×6 IMPLANT
DERMABOND ADVANCED (GAUZE/BANDAGES/DRESSINGS) ×2
DERMABOND ADVANCED .7 DNX12 (GAUZE/BANDAGES/DRESSINGS) ×1 IMPLANT
ELECT REM PT RETURN 9FT ADLT (ELECTROSURGICAL) ×3
ELECTRODE REM PT RTRN 9FT ADLT (ELECTROSURGICAL) ×1 IMPLANT
GLOVE BIO SURGEON STRL SZ7 (GLOVE) ×12 IMPLANT
GLOVE BIOGEL PI IND STRL 7.5 (GLOVE) ×5 IMPLANT
GLOVE BIOGEL PI INDICATOR 7.5 (GLOVE) ×10
GOWN STRL REUS W/ TWL LRG LVL4 (GOWN DISPOSABLE) ×1 IMPLANT
GOWN STRL REUS W/ TWL XL LVL3 (GOWN DISPOSABLE) ×2 IMPLANT
GOWN STRL REUS W/TWL LRG LVL4 (GOWN DISPOSABLE) ×2
GOWN STRL REUS W/TWL XL LVL3 (GOWN DISPOSABLE) ×4
GRASPER SUT TROCAR 14GX15 (MISCELLANEOUS) ×3 IMPLANT
IRRIGATION STRYKERFLOW (MISCELLANEOUS) IMPLANT
IRRIGATOR STRYKERFLOW (MISCELLANEOUS)
KIT TURNOVER KIT A (KITS) ×3 IMPLANT
NEEDLE HYPO 22GX1.5 SAFETY (NEEDLE) ×3 IMPLANT
NEEDLE INSUFFLATION 14GA 120MM (NEEDLE) ×3 IMPLANT
NS IRRIG 1000ML POUR BTL (IV SOLUTION) IMPLANT
NS IRRIG 500ML POUR BTL (IV SOLUTION) ×3 IMPLANT
PACK LAP CHOLECYSTECTOMY (MISCELLANEOUS) ×3 IMPLANT
POUCH SPECIMEN RETRIEVAL 10MM (ENDOMECHANICALS) ×3 IMPLANT
RELOAD 45 VASCULAR/THIN (ENDOMECHANICALS) IMPLANT
RELOAD STAPLE TA45 3.5 REG BLU (ENDOMECHANICALS) ×3 IMPLANT
SHEARS HARMONIC ACE PLUS 36CM (ENDOMECHANICALS) ×3 IMPLANT
SLEEVE ENDOPATH XCEL 5M (ENDOMECHANICALS) ×3 IMPLANT
SOL .9 NS 3000ML IRR  AL (IV SOLUTION)
SOL .9 NS 3000ML IRR UROMATIC (IV SOLUTION) IMPLANT
SUT MNCRL AB 4-0 PS2 18 (SUTURE) ×3 IMPLANT
SUT VICRYL 0 UR6 27IN ABS (SUTURE) ×3 IMPLANT
SUT VICRYL AB 3-0 FS1 BRD 27IN (SUTURE) ×3 IMPLANT
TRAY FOLEY MTR SLVR 16FR STAT (SET/KITS/TRAYS/PACK) ×3 IMPLANT
TROCAR XCEL 12X100 BLDLESS (ENDOMECHANICALS) ×3 IMPLANT
TROCAR XCEL NON-BLD 5MMX100MML (ENDOMECHANICALS) ×3 IMPLANT
TUBING INSUFFLATION (TUBING) ×3 IMPLANT

## 2018-05-06 NOTE — Progress Notes (Signed)
Discharge instructions reviewed with the patient.  IV removed.  Patient sent out via wheelchair to his girlfriends car

## 2018-05-06 NOTE — ED Triage Notes (Signed)
Pt arrives to ED via POV from home with c/o RLQ pain. Pt reports being seen here for same and r/x'd ABX. Pt here for worsening s/x's. Dr Lamont Snowballifenbark at bedside upon pt's arrival to room.

## 2018-05-06 NOTE — H&P (Signed)
Date of Admission:  05/06/2018  Reason for Admission:  Acute appendicitis  History of Present Illness: Erik Tyler is a 27 y.o. male presenting with worsening right lower quadrant pain.  He had initially presented on 6/25 with the same pain and had a CT scan which showed early appendicitis.  His WBC was 10.6 and was discharged home on Augmentin and Percocet, with 48 hour follow up if his pain did not improve.  He does report that his pain somewhat improved on 6/26.  He continue taking his antibiotic as indicated.  However this morning at 2 AM, his pain significantly worsened and thus he presents for further evaluation.  He had a repeat CT scan with IV contrast in the emergency room which showed worsening appendicitis with a dilated appendix of 17 mm and significant periappendiceal stranding.  There is no evidence of rupture or abscess.  Patient reports that he has had some nausea but has not had any vomiting.  He did get a little constipated with his pain medication but had a bowel movement yesterday.  Unfortunately any p.o. intake does cause pain.  Denies any fevers or chills, chest pain, shortness of breath, and vomiting, blood in the stool, dysuria, hematuria.  Past Medical History: Past Medical History:  Diagnosis Date  . Borderline hyperlipidemia   . Pre-diabetes      Past Surgical History: None  Home Medications: Prior to Admission medications   Medication Sig Start Date End Date Taking? Authorizing Provider  amoxicillin-clavulanate (AUGMENTIN) 875-125 MG tablet Take 1 tablet by mouth every 12 (twelve) hours for 10 days. 05/04/18 05/14/18 Yes Emily Filbert, MD  oxyCODONE-acetaminophen (PERCOCET) 5-325 MG tablet Take 1 tablet by mouth every 8 (eight) hours as needed. 05/04/18  Yes Emily Filbert, MD    Allergies: Allergies  Allergen Reactions  . Bee Venom Anaphylaxis  . Wasp Venom Rash and Swelling    Social History:  reports that he has never smoked. He has  never used smokeless tobacco. He reports that he does not drink alcohol or use drugs.   Family History: Father had MI  Review of Systems: Review of Systems  Constitutional: Negative for chills and fever.  HENT: Negative for hearing loss.   Respiratory: Negative for shortness of breath.   Cardiovascular: Negative for chest pain.  Gastrointestinal: Positive for abdominal pain, constipation and nausea. Negative for blood in stool, diarrhea and vomiting.  Genitourinary: Negative for dysuria.  Musculoskeletal: Negative for myalgias.  Skin: Negative for rash.  Neurological: Negative for dizziness.  Psychiatric/Behavioral: Negative for depression.  All other systems reviewed and are negative.   Physical Exam BP (!) 131/111 (BP Location: Left Arm)   Pulse 76   Temp 98.6 F (37 C) (Oral)   Resp 18   SpO2 98%  CONSTITUTIONAL: No acute distress HEENT:  Normocephalic, atraumatic, extraocular motion intact. NECK: Trachea is midline, and there is no jugular venous distension. RESPIRATORY:  Lungs are clear, and breath sounds are equal bilaterally. Normal respiratory effort without pathologic use of accessory muscles. CARDIOVASCULAR: Heart is regular without murmurs, gallops, or rubs. GI: The abdomen is soft, obese, nondistended, with tenderness to palpation in the right lower quadrant, as well as the right flank. There were no palpable masses.  MUSCULOSKELETAL:  Normal muscle strength and tone in all four extremities.  No peripheral edema or cyanosis. SKIN: Skin turgor is normal. There are no pathologic skin lesions.  NEUROLOGIC:  Motor and sensation is grossly normal.  Cranial nerves are grossly intact.  PSYCH:  Alert and oriented to person, place and time. Affect is normal.  Laboratory Analysis: Results for orders placed or performed during the hospital encounter of 05/06/18 (from the past 24 hour(s))  Comprehensive metabolic panel     Status: Abnormal   Collection Time: 05/06/18  6:08 AM   Result Value Ref Range   Sodium 136 135 - 145 mmol/L   Potassium 3.8 3.5 - 5.1 mmol/L   Chloride 100 98 - 111 mmol/L   CO2 27 22 - 32 mmol/L   Glucose, Bld 120 (H) 70 - 99 mg/dL   BUN 18 6 - 20 mg/dL   Creatinine, Ser 1.61 0.61 - 1.24 mg/dL   Calcium 9.1 8.9 - 09.6 mg/dL   Total Protein 8.0 6.5 - 8.1 g/dL   Albumin 4.3 3.5 - 5.0 g/dL   AST 22 15 - 41 U/L   ALT 22 0 - 44 U/L   Alkaline Phosphatase 69 38 - 126 U/L   Total Bilirubin 0.7 0.3 - 1.2 mg/dL   GFR calc non Af Amer >60 >60 mL/min   GFR calc Af Amer >60 >60 mL/min   Anion gap 9 5 - 15  CBC with Differential     Status: Abnormal   Collection Time: 05/06/18  6:08 AM  Result Value Ref Range   WBC 11.1 (H) 3.8 - 10.6 K/uL   RBC 5.41 4.40 - 5.90 MIL/uL   Hemoglobin 15.5 13.0 - 18.0 g/dL   HCT 04.5 40.9 - 81.1 %   MCV 83.0 80.0 - 100.0 fL   MCH 28.6 26.0 - 34.0 pg   MCHC 34.5 32.0 - 36.0 g/dL   RDW 91.4 78.2 - 95.6 %   Platelets 230 150 - 440 K/uL   Neutrophils Relative % 72 %   Neutro Abs 8.0 (H) 1.4 - 6.5 K/uL   Lymphocytes Relative 18 %   Lymphs Abs 1.9 1.0 - 3.6 K/uL   Monocytes Relative 10 %   Monocytes Absolute 1.1 (H) 0.2 - 1.0 K/uL   Eosinophils Relative 1 %   Eosinophils Absolute 0.1 0 - 0.7 K/uL   Basophils Relative 1 %   Basophils Absolute 0.1 0 - 0.1 K/uL    Imaging: Ct Abdomen Pelvis W Contrast  Result Date: 05/06/2018 CLINICAL DATA:  27 year old male with abdominal pain. EXAM: CT ABDOMEN AND PELVIS WITH CONTRAST TECHNIQUE: Multidetector CT imaging of the abdomen and pelvis was performed using the standard protocol following bolus administration of intravenous contrast. CONTRAST:  ISOVUE-300 IOPAMIDOL (ISOVUE-300) INJECTION 61% COMPARISON:  CT of the abdomen pelvis dated 05/04/2018 FINDINGS: Lower chest: The visualized lung bases are clear. No intra-abdominal free air or free fluid. Hepatobiliary: No focal liver abnormality is seen. No gallstones, gallbladder wall thickening, or biliary dilatation.  Pancreas: Unremarkable. No pancreatic ductal dilatation or surrounding inflammatory changes. Spleen: Normal in size without focal abnormality. Adrenals/Urinary Tract: Adrenal glands are unremarkable. Kidneys are normal, without renal calculi, focal lesion, or hydronephrosis. Bladder is unremarkable. Stomach/Bowel: There is no bowel obstruction. The appendix is enlarged and inflamed. The appendix is located in the right lower quadrant posterior to the cecum. The appendix measures up to 17 mm in diameter at the tip. There is periappendiceal stranding and inflammatory changes, increased since the prior CT. No evidence of perforation or abscess. Vascular/Lymphatic: No significant vascular findings are present. No enlarged abdominal or pelvic lymph nodes. Reproductive: The prostate and seminal vesicles are grossly unremarkable. Other: None Musculoskeletal: No acute or significant osseous findings. IMPRESSION: Acute appendicitis.  No evidence of perforation or abscess. Electronically Signed   By: Elgie CollardArash  Radparvar M.D.   On: 05/06/2018 06:37    Assessment and Plan: This is a 27 y.o. male who presents with worsening acute appendicitis.  I have independently reviewed the patient's imaging studies as well as reviewed his laboratory studies and described into the patient.  Overall his CT scan today does show worsening appendicitis particularly at the distal portion of the appendix, which is retrocecal.  There is no evidence of rupture or abscess.  Discussed with the patient he will be admitted to the surgical team with plans for surgery today.  He will be n.p.o. with IV fluid hydration.  He will receive IV antibiotics in the emergency room.  He will also have appropriate pain and nausea medication.  The patient that we will proceed today with a laparoscopic appendectomy, to be done by Dr. Satira MccallumJason Davis.  Discussed with the patient the risks and benefits of the procedure including risk of bleeding, infection, injury to  surrounding structures, and possibility of converting to open procedure.  He understands these risks and is willing to proceed.  All of his questions have been answered.    Howie IllJose Luis Aulani Shipton, MD [facility]

## 2018-05-06 NOTE — Progress Notes (Signed)
Patient doing well with pain control since arriving from surgery.  He did say that when he took percocet at home the other day it made him feel loopy and nauseated.  Dr Earlene Plateravis notified and ordered tramadol

## 2018-05-06 NOTE — Op Note (Signed)
SURGICAL OPERATIVE REPORT  DATE OF PROCEDURE: 05/06/2018  ATTENDING Surgeon(s): Ancil Linseyavis, Jiro Kiester Evan, MD  ANESTHESIA: GETA (General)  PRE-OPERATIVE DIAGNOSIS: Acute non-perforated appendicitis with localized peritonitis (K35.30)  POST-OPERATIVE DIAGNOSIS: Acute non-perforated appendicitis with localized peritonitis (K35.30)  PROCEDURE(S):  1.) Laparoscopic appendectomy (cpt: 44970)  INTRAOPERATIVE FINDINGS: Moderately inflamed non-perforated appendix, particularly inflamed at appendiceal distal half and tip  INTRAVENOUS FLUIDS: 350 mL crystalloid   ESTIMATED BLOOD LOSS: Minimal (<20 mL)  URINE OUTPUT: 350 mL   SPECIMENS: Appendix  IMPLANTS: None  DRAINS: None  COMPLICATIONS: None apparent  CONDITION AT END OF PROCEDURE: Hemodynamically stable and extubated  DISPOSITION OF PATIENT: PACU  INDICATIONS FOR PROCEDURE:  Patient is a 27 y.o. otherwise reportedly healthy albeit obese pre-diabetic male who presented initially 2 days ago to [facility] ED with acute onset of abdominal pain that became greatest at the Right lower quadrant. CT scan at that time demonstrated early/mild appendicitis, and patient was discharged home with antibiotics without surgical consultation. Patient's pain initially improved, but then worsened, for which he returned to ED for follow-up and moderate appendicitis. Patient reported his pain was overall well-controlled in the Emergency Department. All risks, benefits, and alternatives to appendectomy were discussed with the patient, all of patient's questions were answered to his expressed satisfaction, and informed consent was obtained and documented.  DETAILS OF PROCEDURE: Patient was brought to the operating suite and appropriately identified. General anesthesia was administered along with confirmation of appropriate pre-operative antibiotics, and endotracheal intubation was performed by anesthetist, along with NG/OG tube for gastric decompression. In supine  position, operative site was prepped and draped in usual sterile fashion, and following a brief time out, initial 5 mm incision was made in a natural skin crease just below the umbilicus. Fascia was then elevated, and a Verress needle was inserted and its proper position confirmed using saline meniscus test prior to abdominal insufflation.  Upon insufflation of the abdominal cavity with carbon dioxide to a well-tolerated pressure of 12-15 mmHg, a 5 mm peri-umbilical port followed by laparoscope were inserted and used to inspect the abdominal cavity and its contents with no injuries from insertion of the first trochar noted. Two additional trocars were inserted, a 12 mm port at the Left lower quadrant position and another 5 mm port at the suprapubic position. The table was then placed in Trendelenburg position with the Right side up, and blunt graspers were gently used to retract the bowel overlying a clearly inflamed appendix surrounded by moderately severe peri-appendiceal inflammation and minimal murky ascites. The appendix was gently retracted by near the middle of the appendix, and the base of the appendix and mesoappendix were identified in relation to the cecum. The mesoappendix was dissected from the visceral appendix and hemostasis achieved using a harmonic scalpel. Upon freeing the visceral appendix from the mesoappendix, an endostapler loaded with a standard blue tissue load was advanced across the base of the visceral appendix, which was compressed for several seconds, and the stapler was deployed and removed from the abdominal cavity. Hemostasis was confirmed, and the specimen was extracted from the abdominal cavity in a laparoscopic specimen bag.  The intraperitoneal cavity was inspected with no additional findings. PMI laparoscopic fascial closure device was then used to re-approximate fascia at the 12 mm Left lower quadrant port site. All ports were then removed under direct visualization, and the  abdominal cavity was desuflated. All port sites were irrigated/cleaned, additional local anesthetic was injected at each incision, 3-0 Vicryl was used to re-approximate dermis at  10/12 mm port site(s), and subcuticular 4-0 Monocryl suture was used to re-approximate skin. Skin was then cleaned, dried, and sterile skin glue was were applied. Patient was then safely able to be awakened, extubated, and transferred to PACU for post-operative monitoring and care.   I was present for all aspects of the above procedure, and no operative complications were apparent.

## 2018-05-06 NOTE — Transfer of Care (Signed)
Immediate Anesthesia Transfer of Care Note  Patient: Erik Tyler  Procedure(s) Performed: APPENDECTOMY LAPAROSCOPIC (N/A Abdomen)  Patient Location: PACU  Anesthesia Type:General  Level of Consciousness: awake and responds to stimulation  Airway & Oxygen Therapy: Patient Spontanous Breathing and Patient connected to face mask oxygen  Post-op Assessment: Report given to RN and Post -op Vital signs reviewed and stable  Post vital signs: Reviewed and stable  Last Vitals:  Vitals Value Taken Time  BP 127/72 05/06/2018  9:37 AM  Temp    Pulse 107 05/06/2018  9:37 AM  Resp 15 05/06/2018  9:37 AM  SpO2 95 % 05/06/2018  9:37 AM    Last Pain:  Vitals:   05/06/18 0752  TempSrc: Tympanic  PainSc: 6       Patients Stated Pain Goal: 2 (05/06/18 0752)  Complications: No apparent anesthesia complications

## 2018-05-06 NOTE — ED Provider Notes (Signed)
[facility] Emergency Department Provider Note  ____________________________________________   First MD Initiated Contact with Patient 05/06/18 [phone]     (approximate)  I have reviewed the triage vital signs and the nursing notes.   HISTORY  Chief Complaint Abdominal Pain    HPI Erik Tyler is a 27 y.o. male who self presents to the emergency department with several days of worsening right lower quadrant/right upper quadrant pain.  He was seen in our emergency department 2 days ago where he had an unremarkable ultrasound of his right upper quadrant and a CT stone protocol concerning for early appendicitis.  At that time he was prescribed Augmentin and instructed to return in 2 days.  He has had no fevers or chills however this morning at 2 AM roughly 4 hours ago he awoke with severe right sided pain and nausea.  His pain is now constant severe nonradiating.  Is worse with breathing and worse with movement.  Minimally improved with rest.  He has no abdominal surgical history.  No diarrhea.  He takes no medications aside from Augmentin and Percocet in this acute setting.   Past Medical History:  Diagnosis Date  . Borderline hyperlipidemia   . Pre-diabetes     Patient Active Problem List   Diagnosis Date Noted  . Acute appendicitis 05/06/2018    History reviewed. No pertinent surgical history.  Prior to Admission medications   Medication Sig Start Date End Date Taking? Authorizing Provider  amoxicillin-clavulanate (AUGMENTIN) 875-125 MG tablet Take 1 tablet by mouth every 12 (twelve) hours for 10 days. 05/04/18 05/14/18 Yes Emily Filbert, MD  oxyCODONE-acetaminophen (PERCOCET) 5-325 MG tablet Take 1 tablet by mouth every 8 (eight) hours as needed. 05/04/18  Yes Emily Filbert, MD    Allergies Bee venom and Wasp venom  No family history on file.  Social History Social History   Tobacco Use  . Smoking status: Never Smoker  .  Smokeless tobacco: Never Used  Substance Use Topics  . Alcohol use: No  . Drug use: No    Review of Systems Constitutional: No fever/chills Eyes: No visual changes. ENT: No sore throat. Cardiovascular: Denies chest pain. Respiratory: Denies shortness of breath. Gastrointestinal: Positive for abdominal pain.  Positive for nausea, no vomiting.  No diarrhea.  No constipation. Genitourinary: Negative for dysuria. Musculoskeletal: Negative for back pain. Skin: Negative for rash. Neurological: Negative for headaches, focal weakness or numbness.   ____________________________________________   PHYSICAL EXAM:  VITAL SIGNS: ED Triage Vitals [05/06/18 0552]  Enc Vitals Group     BP (!) 131/111     Pulse Rate 76     Resp 18     Temp 98.6 F (37 C)     Temp Source Oral     SpO2 98 %     Weight      Height      Head Circumference      Peak Flow      Pain Score      Pain Loc      Pain Edu?      Excl. in GC?     Constitutional: Alert and oriented x4 double over in pain and appears extremely uncomfortable Eyes: PERRL EOMI. Head: Atraumatic. Nose: No congestion/rhinnorhea. Mouth/Throat: No trismus Neck: No stridor.   Cardiovascular: Normal rate, regular rhythm. Grossly normal heart sounds.  Good peripheral circulation. Respiratory: Normal respiratory effort.  No retractions. Lungs CTAB and moving good air Gastrointestinal: Soft abdomen quite tender right lower  quadrant with rebound.  Negative Rovsing's.  Local peritonitis Musculoskeletal: No lower extremity edema   Neurologic:  Normal speech and language. No gross focal neurologic deficits are appreciated. Skin:  Skin is warm, dry and intact. No rash noted. Psychiatric: Mood and affect are normal. Speech and behavior are normal.    ____________________________________________   DIFFERENTIAL includes but not limited to  Appendicitis, abscess, perforation ____________________________________________   LABS (all labs  ordered are listed, but only abnormal results are displayed)  Labs Reviewed  COMPREHENSIVE METABOLIC PANEL - Abnormal; Notable for the following components:      Result Value   Glucose, Bld 120 (*)    All other components within normal limits  CBC WITH DIFFERENTIAL/PLATELET - Abnormal; Notable for the following components:   WBC 11.1 (*)    Neutro Abs 8.0 (*)    Monocytes Absolute 1.1 (*)    All other components within normal limits  HIV ANTIBODY (ROUTINE TESTING)    Lab work reviewed by me with slightly elevated white count which is nonspecific and could be secondary to pain versus infection __________________________________________  EKG   ____________________________________________  RADIOLOGY  CT abdomen pelvis reviewed by me consistent with acute appendicitis ____________________________________________   PROCEDURES  Procedure(s) performed: no  Procedures  Critical Care performed: no  ____________________________________________   INITIAL IMPRESSION / ASSESSMENT AND PLAN / ED COURSE  Pertinent labs & imaging results that were available during my care of the patient were reviewed by me and considered in my medical decision making (see chart for details).   The patient arrives with worsening right lower quadrant pain with known acute appendicitis 2 days ago.  I will repeat labs and treat with IV morphine and Zofran now.  I will reach out to surgery.     ----------------------------------------- 6:04 AM on 05/06/2018 -----------------------------------------  I spoke with Dr. Aleen CampiPiscoya who recommends a CT scan with IV contrast to evaluate for perforation or abscess.  Ceftriaxone and Flagyl for now.  The patient last had food or water last night at 10 PM roughly 8 hours ago.  ----------------------------------------- 6:54 AM on 05/06/2018 -----------------------------------------  The patient CT scan shows simple appendicitis with no abscess no perforation.  I  discussed with Dr. Aleen CampiPiscoya and who has graciously agreed to admit the patient to his service.  6 mg of IV morphine brought the patient's pain down slightly although he still in significant discomfort.  I will give him another 6 mg IV now along with IV Zofran. ____________________________________________   FINAL CLINICAL IMPRESSION(S) / ED DIAGNOSES  Final diagnoses:  Acute appendicitis, unspecified acute appendicitis type      NEW MEDICATIONS STARTED DURING THIS VISIT:  New Prescriptions   No medications on file     Note:  This document was prepared using Dragon voice recognition software and may include unintentional dictation errors.     Merrily Brittleifenbark, Cybil Senegal, MD 05/06/18 [phone]

## 2018-05-06 NOTE — Discharge Instructions (Signed)
In addition to included general post-operative instructions for Laparoscopic Appendectomy,  Diet: Resume home heart healthy diet.   Activity: No heavy lifting >20 pounds (children, pets, laundry, garbage) or strenuous activity until follow-up, but light activity and walking are encouraged. Do not drive or drink alcohol if taking narcotic pain medications.  Wound care: 2 days after surgery (Saturday, 6/29), you may shower/get incision wet with soapy water and pat dry (do not rub incisions), but no baths or submerging incision underwater until follow-up.   Medications: Resume all home medications. For mild to moderate pain: acetaminophen (Tylenol) or ibuprofen/naproxen (if no kidney disease). Combining Tylenol with alcohol can substantially increase your risk of causing liver disease. Narcotic pain medications, if prescribed, can be used for severe pain, though may cause nausea, constipation, and drowsiness. Do not combine Tylenol and Percocet (or similar) within a 6 hour period as Percocet (and similar) contain(s) Tylenol. If you do not need the narcotic pain medication, you do not need to fill the prescription.  Call office [phone]) at any time if any questions, worsening pain, fevers/chills, bleeding, drainage from incision site, or other concerns.

## 2018-05-06 NOTE — Anesthesia Preprocedure Evaluation (Signed)
Anesthesia Evaluation  Patient identified by MRN, date of birth, ID band Patient awake    Reviewed: Allergy & Precautions, H&P , NPO status , reviewed documented beta blocker date and time   Airway Mallampati: II  TM Distance: >3 FB Neck ROM: full    Dental  (+) Teeth Intact   Pulmonary neg pulmonary ROS,    Pulmonary exam normal        Cardiovascular negative cardio ROS Normal cardiovascular exam     Neuro/Psych negative neurological ROS  negative psych ROS   GI/Hepatic negative GI ROS, Neg liver ROS,   Endo/Other  diabetes  Renal/GU      Musculoskeletal   Abdominal   Peds  Hematology negative hematology ROS (+)   Anesthesia Other Findings Past Medical History: No date: Borderline hyperlipidemia No date: Pre-diabetes History reviewed. No pertinent surgical history. BMI    Body Mass Index:  45.26 kg/m     Reproductive/Obstetrics                             Anesthesia Physical Anesthesia Plan  ASA: II and emergent  Anesthesia Plan: General ETT   Post-op Pain Management:    Induction: Intravenous  PONV Risk Score and Plan: 3 and Ondansetron, Treatment may vary due to age or medical condition, Midazolam and Dexamethasone  Airway Management Planned:   Additional Equipment:   Intra-op Plan:   Post-operative Plan: Extubation in OR  Informed Consent: I have reviewed the patients History and Physical, chart, labs and discussed the procedure including the risks, benefits and alternatives for the proposed anesthesia with the patient or authorized representative who has indicated his/her understanding and acceptance.   Dental Advisory Given  Plan Discussed with: CRNA  Anesthesia Plan Comments:         Anesthesia Quick Evaluation

## 2018-05-06 NOTE — Anesthesia Procedure Notes (Signed)
Procedure Name: Intubation Performed by: Ariella Voit, CRNA Pre-anesthesia Checklist: Patient identified, Patient being monitored, Timeout performed, Emergency Drugs available and Suction available Patient Re-evaluated:Patient Re-evaluated prior to induction Oxygen Delivery Method: Circle system utilized Preoxygenation: Pre-oxygenation with 100% oxygen Induction Type: IV induction Ventilation: Mask ventilation without difficulty Laryngoscope Size: Mac and 3 Grade View: Grade I Tube type: Oral Tube size: 7.5 mm Number of attempts: 1 Airway Equipment and Method: Stylet Placement Confirmation: ETT inserted through vocal cords under direct vision,  positive ETCO2 and breath sounds checked- equal and bilateral Secured at: 21 cm Tube secured with: Tape Dental Injury: Teeth and Oropharynx as per pre-operative assessment        

## 2018-05-06 NOTE — Anesthesia Post-op Follow-up Note (Signed)
Anesthesia QCDR form completed.        

## 2018-05-06 NOTE — Interval H&P Note (Signed)
History and Physical Interval Note:  05/06/2018 7:53 AM  Erik Tyler  has presented today for surgery, with the diagnosis of n/a  The various methods of treatment have been discussed with the patient and family. After consideration of risks, benefits and other options for treatment, the patient has consented to  Procedure(s): APPENDECTOMY LAPAROSCOPIC (N/A) as a surgical intervention .  The patient's history has been reviewed, patient examined, no change in status, stable for surgery.  I have reviewed the patient's chart and labs.  Questions were answered to the patient's satisfaction.     Ancil LinseyJason Evan Sarya Linenberger

## 2018-05-06 NOTE — Anesthesia Postprocedure Evaluation (Signed)
Anesthesia Post Note  Patient: Erik Tyler  Procedure(s) Performed: APPENDECTOMY LAPAROSCOPIC (N/A Abdomen)  Patient location during evaluation: PACU Anesthesia Type: General Level of consciousness: awake and alert Pain management: pain level controlled Vital Signs Assessment: post-procedure vital signs reviewed and stable Respiratory status: spontaneous breathing, nonlabored ventilation and respiratory function stable Cardiovascular status: blood pressure returned to baseline and stable Postop Assessment: no apparent nausea or vomiting Anesthetic complications: no     Last Vitals:  Vitals:   05/06/18 1149 05/06/18 1311  BP: 113/74 102/66  Pulse: 78 69  Resp: 16 17  Temp: 36.8 C   SpO2: 96% 98%    Last Pain:  Vitals:   05/06/18 1149  TempSrc: Oral  PainSc:                  Christia ReadingScott T Meridith Romick

## 2018-05-07 ENCOUNTER — Encounter: Payer: Self-pay | Admitting: Surgery

## 2018-05-09 LAB — SURGICAL PATHOLOGY

## 2018-05-12 NOTE — Discharge Summary (Signed)
Physician Discharge Summary  Patient ID: Erik Tyler MRN: [id number] DOB/AGE: 10-Jan-1991 26 y.o.  Admit date: 05/06/2018 Discharge date: 05/06/2018  Admission Diagnoses:  Discharge Diagnoses:  Active Problems:   Acute appendicitis   Discharged Condition: good  Hospital Course: 27 y.o. male presented to [facility] ED for abdominal pain. Workup was found to be significant for CT imaging demonstrating acute appendicitis. Informed consent was obtained and documented, and patient underwent uneventful laparoscopic appendectomy [name](Davis, 05/06/2018).  Post-operatively, patient's pain improved/resolved and advancement of patient's diet and ambulation were well-tolerated. The remainder of patient's hospital course was essentially unremarkable, and discharge planning was initiated accordingly with patient safely able to be discharged home with appropriate discharge instructions, pain control, and outpatient surgical follow-up after all of his questions were answered to his expressed satisfaction.  Consults: None  Significant Diagnostic Studies: radiology: CT scan: acute appendicitis  Treatments: IV hydration, antibiotics, and surgery: laparoscopic appendectomy (05/06/2018)  Discharge Exam: Blood pressure 102/66, pulse 69, temperature 98.1 F (36.7 C), resp. rate 17, height 5\' 7"  (1.702 m), weight 289 lb (131.1 kg), SpO2 98 %. General appearance: alert, cooperative and no distress GI: abdomen soft and non-distended with mild LLQ peri-incisional tenderness to palpation, incisions well-approximated without surrounding erythema or drainage  Disposition:    Allergies as of 05/06/2018      Reactions   Bee Venom Anaphylaxis   Wasp Venom Rash, Swelling      Medication List    STOP taking these medications   amoxicillin-clavulanate 875-125 MG tablet Commonly known as:  AUGMENTIN   oxyCODONE-acetaminophen 5-325 MG tablet Commonly known as:  PERCOCET     TAKE these medications   traMADol 50  MG tablet Commonly known as:  ULTRAM Take 1-2 tablets (50-100 mg total) by mouth every 6 (six) hours as needed for severe pain.      Follow-up Information    Ancil Linseyavis, Jason Evan, MD. Go on 05/20/2018.   Specialty:  General Surgery Why:  Go at 3:45pm. Contact information: [address] [place] [address] [phone]           Signed: Ancil LinseyJason Evan Davis 05/12/2018, 7:43 AM

## 2018-05-19 ENCOUNTER — Other Ambulatory Visit: Payer: Self-pay

## 2018-05-20 ENCOUNTER — Encounter: Payer: Self-pay | Admitting: Surgery

## 2018-05-20 ENCOUNTER — Ambulatory Visit (INDEPENDENT_AMBULATORY_CARE_PROVIDER_SITE_OTHER): Payer: Managed Care, Other (non HMO) | Admitting: Surgery

## 2018-05-20 VITALS — BP 144/87 | HR 92 | Temp 98.7°F | Wt 293.0 lb

## 2018-05-20 DIAGNOSIS — Z4889 Encounter for other specified surgical aftercare: Secondary | ICD-10-CM

## 2018-05-20 DIAGNOSIS — K3580 Unspecified acute appendicitis: Secondary | ICD-10-CM

## 2018-05-20 NOTE — Patient Instructions (Signed)
Please give us a call in case you have any questions or concerns.  GENERAL POST-OPERATIVE PATIENT INSTRUCTIONS   WOUND CARE INSTRUCTIONS:  Keep a dry clean dressing on the wound if there is drainage. The initial bandage may be removed after 24 hours.  Once the wound has quit draining you may leave it open to air.  If clothing rubs against the wound or causes irritation and the wound is not draining you may cover it with a dry dressing during the daytime.  Try to keep the wound dry and avoid ointments on the wound unless directed to do so.  If the wound becomes bright red and painful or starts to drain infected material that is not clear, please contact your physician immediately.  If the wound is mildly pink and has a thick firm ridge underneath it, this is normal, and is referred to as a healing ridge.  This will resolve over the next 4-6 weeks.  BATHING: You may shower if you have been informed of this by your surgeon. However, Please do not submerge in a tub, hot tub, or pool until incisions are completely sealed or have been told by your surgeon that you may do so.  DIET:  You may eat any foods that you can tolerate.  It is a good idea to eat a high fiber diet and take in plenty of fluids to prevent constipation.  If you do become constipated you may want to take a mild laxative or take ducolax tablets on a daily basis until your bowel habits are regular.  Constipation can be very uncomfortable, along with straining, after recent surgery.  ACTIVITY:  You are encouraged to cough and deep breath or use your incentive spirometer if you were given one, every 15-30 minutes when awake.  This will help prevent respiratory complications and low grade fevers post-operatively if you had a general anesthetic.  You may want to hug a pillow when coughing and sneezing to add additional support to the surgical area, if you had abdominal or chest surgery, which will decrease pain during these times.  You are  encouraged to walk and engage in light activity for the next two weeks.  You should not lift more than 40 pounds, until 06/06/2017 as it could put you at increased risk for complications.  Twenty pounds is roughly equivalent to a plastic bag of groceries. At that time- Listen to your body when lifting, if you have pain when lifting, stop and then try again in a few days. Soreness after doing exercises or activities of daily living is normal as you get back in to your normal routine.  MEDICATIONS:  Try to take narcotic medications and anti-inflammatory medications, such as tylenol, ibuprofen, naprosyn, etc., with food.  This will minimize stomach upset from the medication.  Should you develop nausea and vomiting from the pain medication, or develop a rash, please discontinue the medication and contact your physician.  You should not drive, make important decisions, or operate machinery when taking narcotic pain medication.  SUNBLOCK Use sun block to incision area over the next year if this area will be exposed to sun. This helps decrease scarring and will allow you avoid a permanent darkened area over your incision.  QUESTIONS:  Please feel free to call our office if you have any questions, and we will be glad to assist you. [phone]

## 2018-05-21 ENCOUNTER — Encounter: Payer: Self-pay | Admitting: Surgery

## 2018-05-21 NOTE — Progress Notes (Signed)
Surgical Clinic Progress/Follow-up Note   HPI:  [name] presents to clinic for post-op follow-up 2 weeks s/p laparoscopic appendectomy Erik Tyler(Davis, 05/06/2018). Patient reports complete resolution of pre-operative pain and has been tolerating regular diet with +flatus and normal BM's, denies N/V, fever/chills, CP, or SOB.  Review of Systems:  Constitutional: denies fever/chills  Respiratory: denies shortness of breath, wheezing  Cardiovascular: denies chest pain, palpitations  Gastrointestinal: abdominal pain, N/V, and bowel function as per interval history Skin: Denies any other rashes or skin discolorations except post-surgical wounds as per interval history  Vital Signs:  BP (!) 144/87   Pulse 92   Temp 98.7 F (37.1 C) (Oral)   Wt 293 lb (132.9 kg)   BMI 45.89 kg/m    Physical Exam:  Constitutional:  -- Obese body habitus  -- Awake, alert, and oriented x3  Pulmonary:  -- No crackles -- Equal breath sounds bilaterally -- Breathing non-labored at rest Cardiovascular:  -- S1, S2 present  -- No pericardial rubs  Gastrointestinal:  -- Soft and non-distended, non-tender to palpation, no guarding/rebound tenderness -- Post-surgical incisions all well-approximated without any peri-incisional erythema or drainage -- No abdominal masses appreciated, pulsatile or otherwise  Musculoskeletal / Integumentary:  -- Wounds or skin discoloration: None appreciated except post-surgical incisions as described above (GI) -- Extremities: B/L UE and LE FROM, hands and feet warm   Assessment:  27 y.o. yo Male with a problem list including...  Patient Active Problem List   Diagnosis Date Noted  . Acute appendicitis 05/06/2018  . Morbid obesity with BMI of 40.0-44.9, adult (HCC) 04/18/2016  . Tonsillith 03/27/2016    presents to clinic for post-op follow-up evaluation, doing well 2 weeks s/p laparoscopic appendectomy Erik Tyler(Davis, 05/06/2018) for acute non-perforated appendicitis.  Plan:           - advance diet as tolerated              - okay to submerge incisions under water (baths, swimming) prn             - gradually resume all activities without restrictions after next 2 weeks             - apply sunblock particularly to incisions with sun exposure to reduce pigmentation of scars             - return to clinic as needed, instructed to call office if any questions or concerns  All of the above recommendations were discussed with the patient, and all of patient's questions were answered to his expressed satisfaction.  -- Erik GerlachJason E. Erik Plateravis, MD, RPVI [place]: [facility] General Surgery - Partnering for exceptional care. Office: [phone]

## 2019-12-06 ENCOUNTER — Ambulatory Visit: Payer: BC Managed Care – PPO | Attending: Internal Medicine

## 2019-12-06 DIAGNOSIS — Z20822 Contact with and (suspected) exposure to covid-19: Secondary | ICD-10-CM

## 2019-12-06 DIAGNOSIS — U071 COVID-19: Secondary | ICD-10-CM | POA: Insufficient documentation

## 2019-12-07 LAB — NOVEL CORONAVIRUS, NAA: SARS-CoV-2, NAA: DETECTED — AB

## 2019-12-08 ENCOUNTER — Telehealth: Payer: Self-pay | Admitting: Adult Health

## 2019-12-08 NOTE — Telephone Encounter (Signed)
Called to discuss with Nickie Retort about Covid symptoms and the use of bamlanivimab, a monoclonal antibody infusion for those with mild to moderate Covid symptoms and at a high risk of hospitalization.     Pt is qualified for this infusion at the [facility] infusion center due to co-morbid conditions and/or a member of an at-risk group, however declines infusion at this time. Symptoms tier reviewed as well as criteria for ending isolation.  Symptoms reviewed that would warrant ED/Hospital evaluation. Preventative practices reviewed. Patient verbalized understanding. Patient advised to call back if he decides that he does want to get infusion. Callback number to the infusion center given. Patient advised to go to Urgent care or ED with severe symptoms. Last date he would be eligible for infusion is 12/14/19       Patient Active Problem List   Diagnosis Date Noted  . Morbid obesity with BMI of 40.0-44.9, adult (HCC) 04/18/2016  . Tonsillith 03/27/2016    Alette Kataoka NP-C  [facility]    12/08/2019

## 2019-12-09 ENCOUNTER — Encounter: Payer: Self-pay | Admitting: Emergency Medicine

## 2019-12-09 ENCOUNTER — Emergency Department
Admission: EM | Admit: 2019-12-09 | Discharge: 2019-12-09 | Disposition: A | Discharge status: Home / Self Care | Payer: BC Managed Care – PPO | Attending: Emergency Medicine | Admitting: Emergency Medicine

## 2019-12-09 ENCOUNTER — Emergency Department: Payer: BC Managed Care – PPO

## 2019-12-09 ENCOUNTER — Other Ambulatory Visit: Payer: Self-pay

## 2019-12-09 DIAGNOSIS — J1289 Other viral pneumonia: Secondary | ICD-10-CM | POA: Diagnosis not present

## 2019-12-09 DIAGNOSIS — U071 COVID-19: Secondary | ICD-10-CM | POA: Diagnosis not present

## 2019-12-09 DIAGNOSIS — R042 Hemoptysis: Secondary | ICD-10-CM

## 2019-12-09 DIAGNOSIS — Z91030 Bee allergy status: Secondary | ICD-10-CM | POA: Insufficient documentation

## 2019-12-09 DIAGNOSIS — J1282 Pneumonia due to coronavirus disease 2019: Secondary | ICD-10-CM

## 2019-12-09 LAB — BASIC METABOLIC PANEL
Anion gap: 8 (ref 5–15)
BUN: 11 mg/dL (ref 6–20)
CO2: 27 mmol/L (ref 22–32)
Calcium: 8.7 mg/dL — ABNORMAL LOW (ref 8.9–10.3)
Chloride: 102 mmol/L (ref 98–111)
Creatinine, Ser: 0.83 mg/dL (ref 0.61–1.24)
GFR calc Af Amer: >60 mL/min (ref >60)
GFR calc non Af Amer: >60 mL/min (ref >60)
Glucose, Bld: 104 mg/dL — ABNORMAL HIGH (ref 70–99)
Potassium: 4.9 mmol/L (ref 3.5–5.1)
Sodium: 137 mmol/L (ref 135–145)

## 2019-12-09 LAB — CBC WITH DIFFERENTIAL/PLATELET
Abs Immature Granulocytes: 0.01 10*3/uL (ref 0.00–0.07)
Basophils Absolute: 0 10*3/uL (ref 0.0–0.1)
Basophils Relative: 0 %
Eosinophils Absolute: 0 10*3/uL (ref 0.0–0.5)
Eosinophils Relative: 1 %
HCT: 47.7 % (ref 39.0–52.0)
Hemoglobin: 16.1 g/dL (ref 13.0–17.0)
Immature Granulocytes: 0 %
Lymphocytes Relative: 18 %
Lymphs Abs: 1 10*3/uL (ref 0.7–4.0)
MCH: 28.2 pg (ref 26.0–34.0)
MCHC: 33.8 g/dL (ref 30.0–36.0)
MCV: 83.7 fL (ref 80.0–100.0)
Monocytes Absolute: 0.5 10*3/uL (ref 0.1–1.0)
Monocytes Relative: 10 %
Neutro Abs: 3.9 10*3/uL (ref 1.7–7.7)
Neutrophils Relative %: 71 %
Platelets: 205 10*3/uL (ref 150–400)
RBC: 5.7 MIL/uL (ref 4.22–5.81)
RDW: 12 % (ref 11.5–15.5)
WBC: 5.5 10*3/uL (ref 4.0–10.5)
nRBC: 0 % (ref 0.0–0.2)

## 2019-12-09 LAB — TROPONIN I (HIGH SENSITIVITY): Troponin I (High Sensitivity): 3 ng/L (ref <18)

## 2019-12-09 MED ORDER — PREDNISONE 20 MG PO TABS: 60.0000 mg | ORAL_TABLET | Freq: Every day | ORAL | 0 refills | Status: AC

## 2019-12-09 MED ORDER — IOHEXOL 350 MG/ML SOLN
100.0000 mL | Freq: Once | INTRAVENOUS | Status: AC | PRN
  Administered 2019-12-09: 11:00:00 100 mL via INTRAVENOUS

## 2019-12-09 MED ORDER — ALBUTEROL SULFATE HFA 108 (90 BASE) MCG/ACT IN AERS
2.0000 | INHALATION_SPRAY | Freq: Once | RESPIRATORY_TRACT | Status: AC
  Administered 2019-12-09: 11:00:00 2 via RESPIRATORY_TRACT
  Filled 2019-12-09: qty 6.7

## 2019-12-09 MED ORDER — LACTATED RINGERS IV BOLUS
1000.0000 mL | Freq: Once | INTRAVENOUS | Status: AC
  Administered 2019-12-09: 10:00:00 1000 mL via INTRAVENOUS

## 2019-12-09 MED ORDER — KETOROLAC TROMETHAMINE 30 MG/ML IJ SOLN
15.0000 mg | Freq: Once | INTRAMUSCULAR | Status: AC
  Administered 2019-12-09: 10:00:00 15 mg via INTRAVENOUS
  Filled 2019-12-09: qty 1

## 2019-12-09 MED ORDER — PREDNISONE 20 MG PO TABS
60.0000 mg | ORAL_TABLET | Freq: Once | ORAL | Status: AC
  Administered 2019-12-09: 10:00:00 60 mg via ORAL
  Filled 2019-12-09: qty 3

## 2019-12-09 NOTE — ED Notes (Signed)
E-signature not working at this time. Pt verbalized understanding of D/C instructions, prescriptions and follow up care with no further questions at this time. Pt in NAD and ambulatory at time of D/C.  

## 2019-12-09 NOTE — ED Triage Notes (Signed)
Tested positive for COVID 1/26.    States coughed up some blood this morning.  Also c/o body aches.  AAOx3.  Skin warm and dry. NAD.  No SOB/ DOE.

## 2019-12-09 NOTE — ED Provider Notes (Signed)
[facility] Emergency Department Provider Note   ____________________________________________   First MD Initiated Contact with Patient 12/09/19 [phone]     (approximate)  I have reviewed the triage vital signs and the nursing notes.   HISTORY  Chief Complaint Hemoptysis    HPI Erik Tyler is a 29 y.o. male with past medical history of asthma who presents to the ED complaining of hemoptysis. Patient reports that he initially started feeling achy with congestion 5 days ago, subsequently tested positive for COVID-19 3 days ago. He has continued to have significant cough as well as soreness in his chest and difficulty breathing at times. Earlier this morning, he noticed that he was coughing up blood and feeling weaker than usual. He denies ongoing fevers, but has had significant body aches. He has been using his albuterol at home with some relief, last used it yesterday. He has not currently prescribed any medications for COVID-19.        Past Medical History:  Diagnosis Date  . Borderline hyperlipidemia   . Pre-diabetes     Patient Active Problem List   Diagnosis Date Noted  . Morbid obesity with BMI of 40.0-44.9, adult (HCC) 04/18/2016  . Tonsillith 03/27/2016    Past Surgical History:  Procedure Laterality Date  . LAPAROSCOPIC APPENDECTOMY N/A 05/06/2018   Procedure: APPENDECTOMY LAPAROSCOPIC;  Surgeon: Ancil Linsey, MD;  Location: [facility] ORS;  Service: General;  Laterality: N/A;    Prior to Admission medications   Medication Sig Start Date End Date Taking? Authorizing Provider  predniSONE (DELTASONE) 20 MG tablet Take 3 tablets (60 mg total) by mouth daily for 5 days. 12/09/19 12/14/19  Chesley Noon, MD    Allergies Bee venom and Wasp venom  No family history on file.  Social History Social History   Tobacco Use  . Smoking status: Never Smoker  . Smokeless tobacco: Never Used  Substance Use Topics  . Alcohol use: No  .  Drug use: No    Review of Systems  Constitutional: No fever/chills. Positive for generalized weakness. Eyes: No visual changes. ENT: No sore throat. Cardiovascular: Positive for chest pain. Respiratory: Positive for cough, hemoptysis, and shortness of breath. Gastrointestinal: No abdominal pain.  No nausea, no vomiting.  No diarrhea.  No constipation. Genitourinary: Negative for dysuria. Musculoskeletal: Negative for back pain. Positive for myalgias. Skin: Negative for rash. Neurological: Negative for headaches, focal weakness or numbness.  ____________________________________________   PHYSICAL EXAM:  VITAL SIGNS: ED Triage Vitals  Enc Vitals Group     BP 12/09/19 0854 (!) 137/94     Pulse Rate 12/09/19 0854 (!) 109     Resp 12/09/19 0854 20     Temp 12/09/19 0854 98.9 F (37.2 C)     Temp Source 12/09/19 0854 Oral     SpO2 12/09/19 0854 96 %     Weight 12/09/19 0851 292 lb 15.9 oz (132.9 kg)     Height --      Head Circumference --      Peak Flow --      Pain Score 12/09/19 0851 0     Pain Loc --      Pain Edu? --      Excl. in GC? --     Constitutional: Alert and oriented. Eyes: Conjunctivae are normal. Head: Atraumatic. Nose: No congestion/rhinnorhea. Mouth/Throat: Mucous membranes are moist. Neck: Normal ROM Cardiovascular: Tachycardic, regular rhythm. Grossly normal heart sounds. Respiratory: Normal respiratory effort.  No retractions. Faint expiratory wheezing.  Gastrointestinal: Soft and nontender. No distention. Genitourinary: deferred Musculoskeletal: No lower extremity tenderness nor edema. Neurologic:  Normal speech and language. No gross focal neurologic deficits are appreciated. Skin:  Skin is warm, dry and intact. No rash noted. Psychiatric: Mood and affect are normal. Speech and behavior are normal.  ____________________________________________   LABS (all labs ordered are listed, but only abnormal results are displayed)  Labs Reviewed    BASIC METABOLIC PANEL - Abnormal; Notable for the following components:      Result Value   Glucose, Bld 104 (*)    Calcium 8.7 (*)    All other components within normal limits  CBC WITH DIFFERENTIAL/PLATELET  TROPONIN I (HIGH SENSITIVITY)  TROPONIN I (HIGH SENSITIVITY)   ____________________________________________  EKG  ED ECG REPORT I, Blake Divine, the attending physician, personally viewed and interpreted this ECG.   Date: 12/09/2019  EKG Time: 9:19  Rate: 102  Rhythm: sinus tachycardia  Axis: Normal  Intervals:none  ST&T Change: None   PROCEDURES  Procedure(s) performed (including Critical Care):  Procedures   ____________________________________________   INITIAL IMPRESSION / ASSESSMENT AND PLAN / ED COURSE       29 year old male with history of asthma presents to the ED with increasing cough, chest pain, and shortness of breath since being diagnosed with COVID-19 3 days ago. He additionally noted some hemoptysis earlier this morning, is tachycardic on arrival and given increased risk of PE with COVID-19, will check CTA of chest. He is not in any respiratory distress, but does have some faint expiratory wheezing and given his history of asthma as well as recent diagnosis of COVID-19, will initiate treatment with steroids and give dose of albuterol. Low suspicion for ACS, will screen EKG and troponin.  CTA negative for PE, does show findings consistent with COVID-19 diagnosis.  Lab work is also unremarkable, patient feeling slightly better following dose of steroids, albuterol, Toradol, and IV fluids here in the ED.  Will prescribe short course of steroids and counseled patient to continue albuterol as needed.  Given minimal hemoptysis for now, he is appropriate for outpatient management although I have counseled him to return to the ED for increasing hemoptysis, shortness of breath, chest pain, or any other concerning symptoms.  Patient agrees with plan.       ____________________________________________   FINAL CLINICAL IMPRESSION(S) / ED DIAGNOSES  Final diagnoses:  Pneumonia due to COVID-19 virus  Hemoptysis     ED Discharge Orders         Ordered    predniSONE (DELTASONE) 20 MG tablet  Daily     12/09/19 1120           Note:  This document was prepared using Dragon voice recognition software and may include unintentional dictation errors.   Blake Divine, MD 12/09/19 1544

## 2020-05-06 IMAGING — CT CT ANGIO CHEST
2 of 6 series · 17 of 46 positions shown · IV contrast (APPLIED)
Comparison: Chest x-ray dated 07/17/2017 and chest CT dated
11/02/2013

CLINICAL DATA: Shortness of breath. Hematemesis. W3JHD-WE diagnosed
on 12/06/2019

EXAM:
CT ANGIOGRAPHY CHEST WITH CONTRAST
TECHNIQUE: Multidetector CT imaging of the chest was performed using the
standard protocol during bolus administration of intravenous
contrast. Multiplanar CT image reconstructions and MIPs were
obtained to evaluate the vascular anatomy.
CONTRAST:  100mL OMNIPAQUE IOHEXOL 350 MG/ML SOLN

[Series 5: thins · axial · 0.80mm/px · z∈[-392,-122]mm · 14 of 296 slices shown]
[im 13/296  lung]
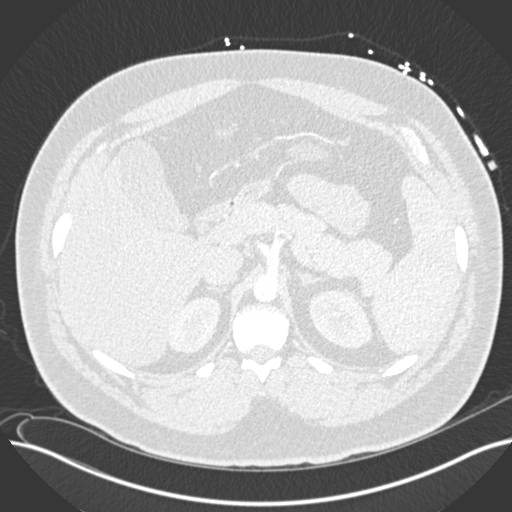
[im 39/296  soft-tissue]
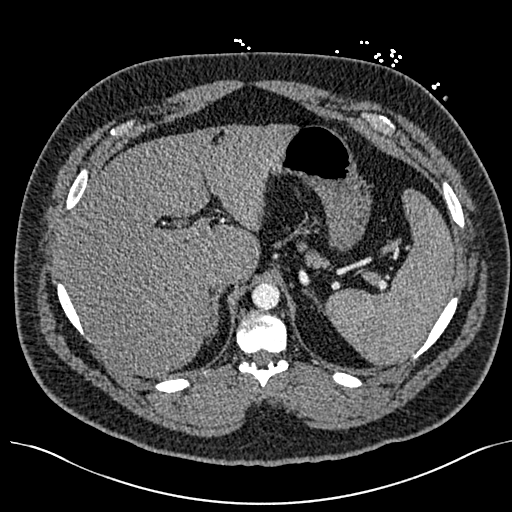
[im 52/296  lung]
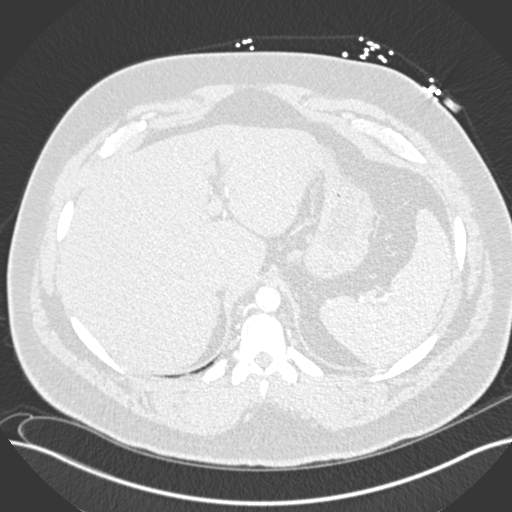
[im 77/296  soft-tissue]
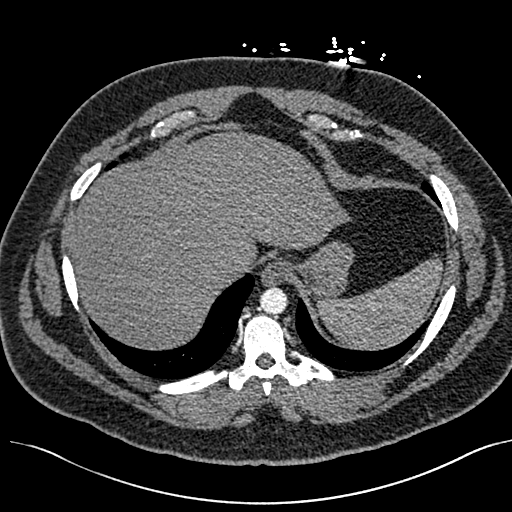
[im 103/296  lung]
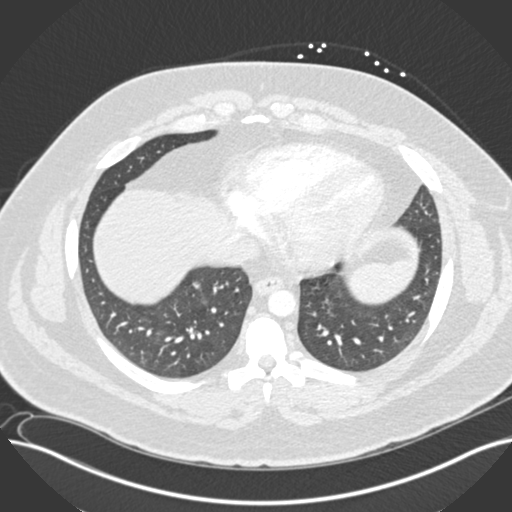
[im 116/296  soft-tissue]
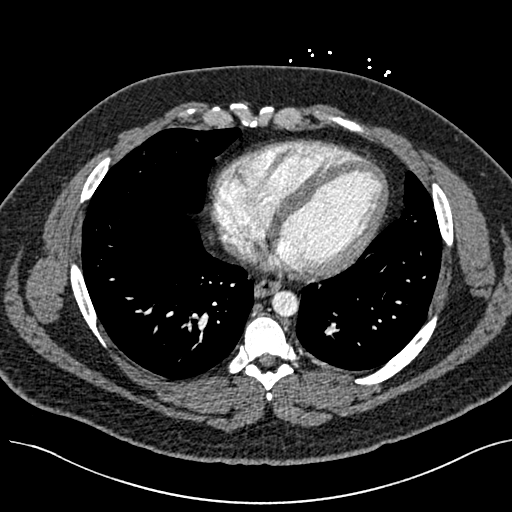
[im 142/296  lung]
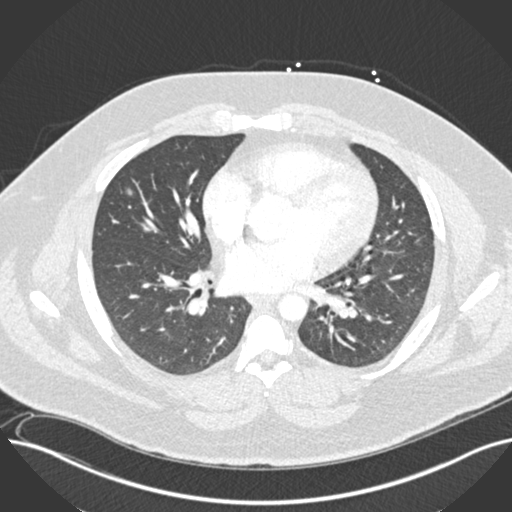
[im 154/296  soft-tissue]
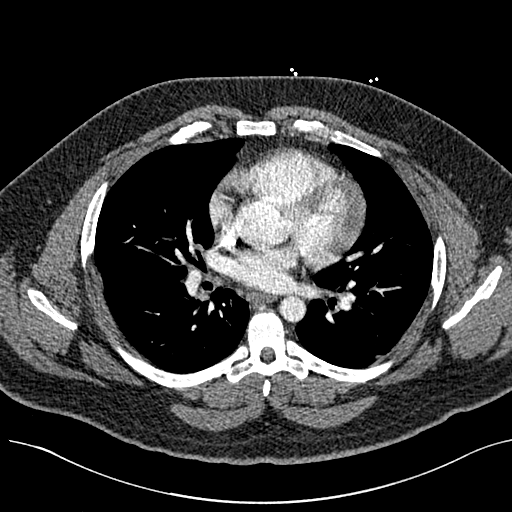
[im 180/296  lung]
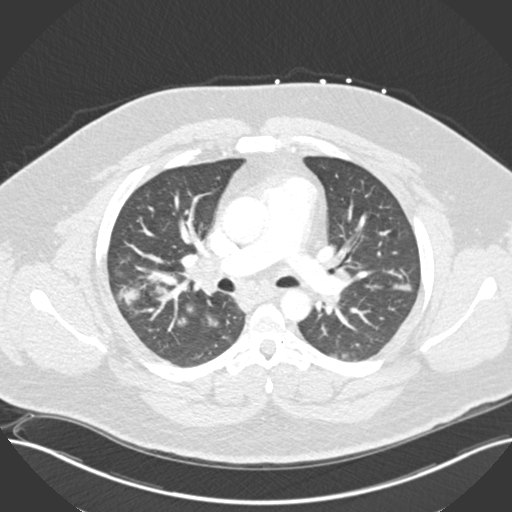
[im 193/296  soft-tissue]
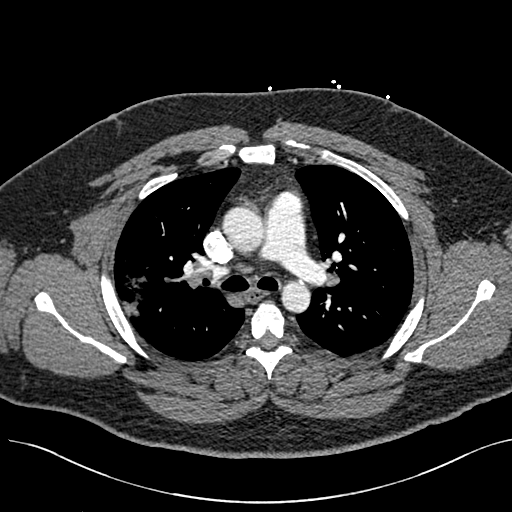
[im 219/296  lung]
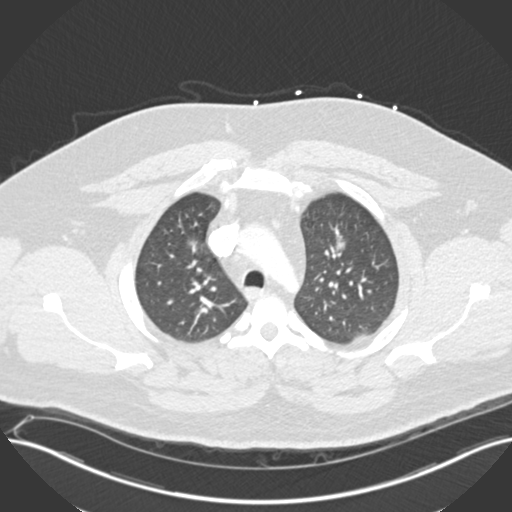
[im 244/296  soft-tissue]
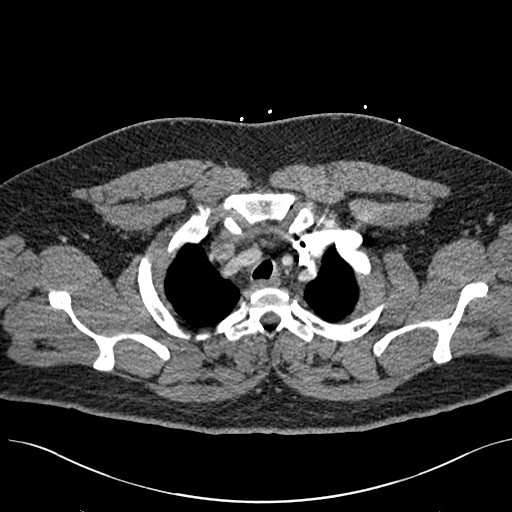
[im 257/296  lung]
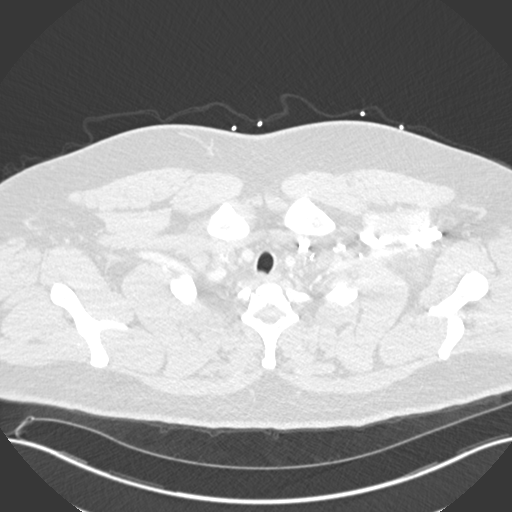
[im 283/296  soft-tissue]
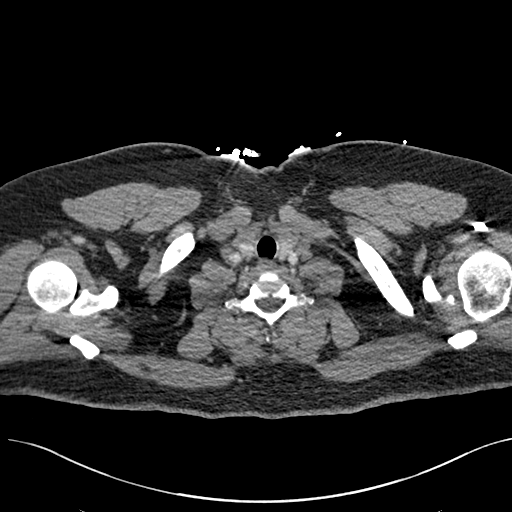

[Series 7: coronal mpr · coronal · 0.62mm/px · 3 of 114 slices shown]
[im 29/114  soft-tissue]
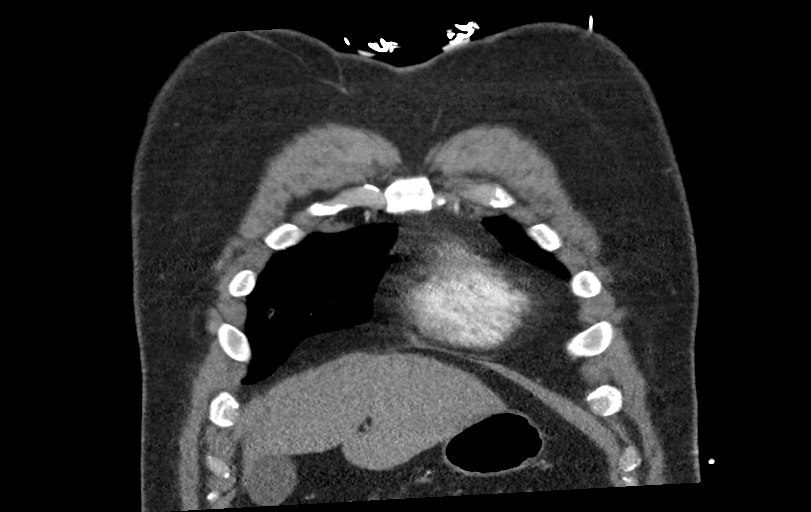
[im 57/114  soft-tissue]
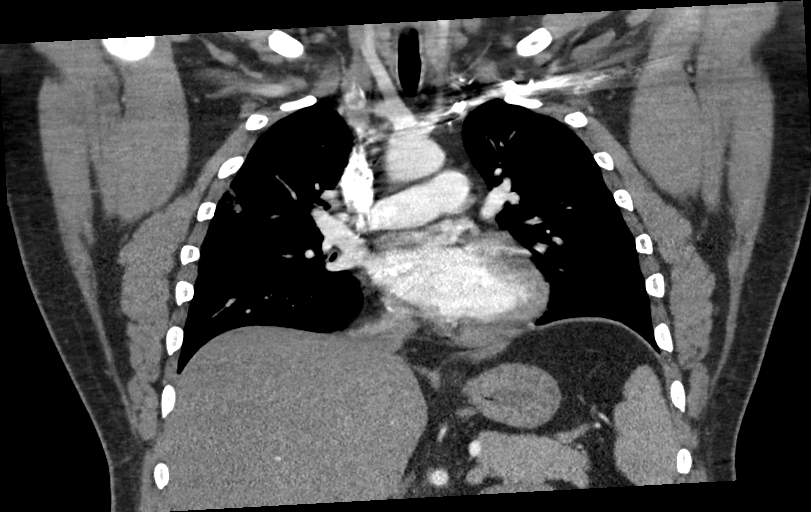
[im 85/114  soft-tissue]
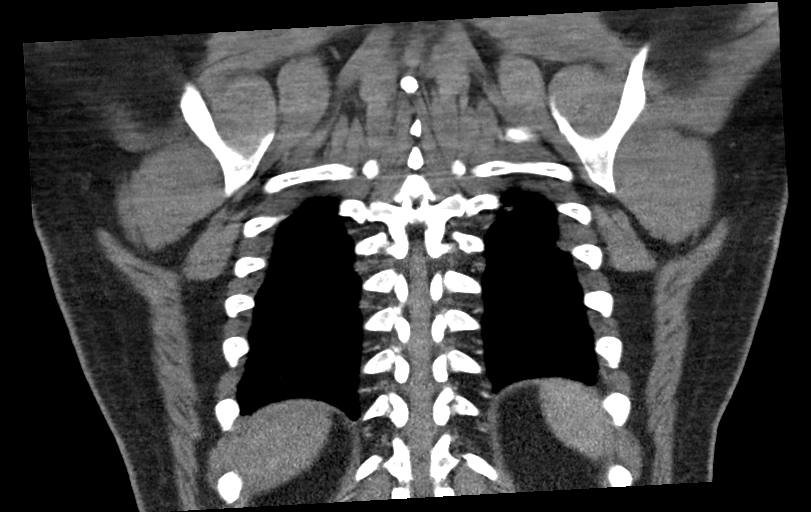

[17 of 46 positions shown; findings below may reference images not displayed]

FINDINGS: Cardiovascular: Satisfactory opacification of the pulmonary arteries
to the segmental level. No evidence of definite pulmonary embolism.
There is slight decreased density in 2 branches of the pulmonary
artery to the left lower lobe but I do not think this represents
pulmonary emboli based on the reconstructed images. Normal heart
size. No pericardial effusion.

Mediastinum/Nodes: No enlarged mediastinal, hilar, or axillary lymph
nodes. Thyroid gland, trachea, and esophagus demonstrate no
significant findings.

Lungs/Pleura: There are multiple small patchy peripheral infiltrates
in the right lung involving all lobes. There are a few tiny
primarily peripheral infiltrates in both lobes of the lung. No
effusions.

Upper Abdomen: Normal.

Musculoskeletal: No chest wall abnormality. No acute or significant
osseous findings.

Review of the MIP images confirms the above findings.
IMPRESSION: 1. No definite pulmonary emboli.
2. Multiple small patchy peripheral infiltrates in the right lung
involving all lobes of the lung. There are a few tiny primarily
peripheral infiltrates in both lobes of the lung. The findings are
consistent with the patient's history of W3JHD-WE infection.

## 2023-03-30 ENCOUNTER — Emergency Department: Payer: Worker's Compensation

## 2023-03-30 ENCOUNTER — Emergency Department
Admission: EM | Admit: 2023-03-30 | Discharge: 2023-03-31 | Disposition: A | Discharge status: Home / Self Care | Payer: Worker's Compensation | Attending: Emergency Medicine | Admitting: Emergency Medicine

## 2023-03-30 DIAGNOSIS — R509 Fever, unspecified: Secondary | ICD-10-CM | POA: Diagnosis not present

## 2023-03-30 DIAGNOSIS — Z1152 Encounter for screening for COVID-19: Secondary | ICD-10-CM | POA: Insufficient documentation

## 2023-03-30 DIAGNOSIS — R519 Headache, unspecified: Secondary | ICD-10-CM | POA: Insufficient documentation

## 2023-03-30 DIAGNOSIS — J45909 Unspecified asthma, uncomplicated: Secondary | ICD-10-CM | POA: Insufficient documentation

## 2023-03-30 DIAGNOSIS — R55 Syncope and collapse: Secondary | ICD-10-CM | POA: Diagnosis not present

## 2023-03-30 LAB — CBC WITH DIFFERENTIAL/PLATELET
Abs Immature Granulocytes: 0.01 10*3/uL (ref 0.00–0.07)
Basophils Absolute: 0 10*3/uL (ref 0.0–0.1)
Basophils Relative: 0 %
Eosinophils Absolute: 0.1 10*3/uL (ref 0.0–0.5)
Eosinophils Relative: 1 %
HCT: 41.7 % (ref 39.0–52.0)
Hemoglobin: 13.9 g/dL (ref 13.0–17.0)
Immature Granulocytes: 0 %
Lymphocytes Relative: 12 %
Lymphs Abs: 0.6 10*3/uL — ABNORMAL LOW (ref 0.7–4.0)
MCH: 28.4 pg (ref 26.0–34.0)
MCHC: 33.3 g/dL (ref 30.0–36.0)
MCV: 85.1 fL (ref 80.0–100.0)
Monocytes Absolute: 0.7 10*3/uL (ref 0.1–1.0)
Monocytes Relative: 14 %
Neutro Abs: 3.4 10*3/uL (ref 1.7–7.7)
Neutrophils Relative %: 73 %
Platelets: 186 10*3/uL (ref 150–400)
RBC: 4.9 MIL/uL (ref 4.22–5.81)
RDW: 12.1 % (ref 11.5–15.5)
WBC: 4.8 10*3/uL (ref 4.0–10.5)
nRBC: 0 % (ref 0.0–0.2)

## 2023-03-30 LAB — URINALYSIS, ROUTINE W REFLEX MICROSCOPIC
Bilirubin Urine: NEGATIVE
Glucose, UA: NEGATIVE mg/dL
Hgb urine dipstick: NEGATIVE
Ketones, ur: NEGATIVE mg/dL
Leukocytes,Ua: NEGATIVE
Nitrite: NEGATIVE
Protein, ur: NEGATIVE mg/dL
Specific Gravity, Urine: 1.024 (ref 1.005–1.030)
pH: 6 (ref 5.0–8.0)

## 2023-03-30 LAB — BASIC METABOLIC PANEL
Anion gap: 9 (ref 5–15)
BUN: 15 mg/dL (ref 6–20)
CO2: 27 mmol/L (ref 22–32)
Calcium: 8.8 mg/dL — ABNORMAL LOW (ref 8.9–10.3)
Chloride: 99 mmol/L (ref 98–111)
Creatinine, Ser: 1 mg/dL (ref 0.61–1.24)
GFR, Estimated: >60 mL/min (ref >60)
Glucose, Bld: 145 mg/dL — ABNORMAL HIGH (ref 70–99)
Potassium: 4 mmol/L (ref 3.5–5.1)
Sodium: 135 mmol/L (ref 135–145)

## 2023-03-30 LAB — TROPONIN I (HIGH SENSITIVITY)
Troponin I (High Sensitivity): 4 ng/L (ref <18)
Troponin I (High Sensitivity): 4 ng/L (ref <18)

## 2023-03-30 LAB — SARS CORONAVIRUS 2 BY RT PCR: SARS Coronavirus 2 by RT PCR: NEGATIVE

## 2023-03-30 MED ORDER — ACETAMINOPHEN 325 MG PO TABS
650.0000 mg | ORAL_TABLET | Freq: Once | ORAL | Status: AC
  Administered 2023-03-30: 650 mg via ORAL
  Filled 2023-03-30: qty 2

## 2023-03-30 MED ORDER — KETOROLAC TROMETHAMINE 30 MG/ML IJ SOLN
30.0000 mg | Freq: Once | INTRAMUSCULAR | Status: AC
  Administered 2023-03-30: 30 mg via INTRAVENOUS
  Filled 2023-03-30: qty 1

## 2023-03-30 MED ORDER — METOCLOPRAMIDE HCL 5 MG/ML IJ SOLN
10.0000 mg | Freq: Once | INTRAMUSCULAR | Status: AC
  Administered 2023-03-30: 10 mg via INTRAVENOUS
  Filled 2023-03-30: qty 2

## 2023-03-30 MED ORDER — ONDANSETRON 4 MG PO TBDP
4.0000 mg | ORAL_TABLET | Freq: Once | ORAL | Status: AC
  Administered 2023-03-30: 4 mg via ORAL
  Filled 2023-03-30: qty 1

## 2023-03-30 MED ORDER — SODIUM CHLORIDE 0.9 % IV BOLUS
1000.0000 mL | Freq: Once | INTRAVENOUS | Status: AC
  Administered 2023-03-30: 1000 mL via INTRAVENOUS

## 2023-03-30 MED ORDER — DIPHENHYDRAMINE HCL 50 MG/ML IJ SOLN
25.0000 mg | Freq: Once | INTRAMUSCULAR | Status: AC
  Administered 2023-03-30: 25 mg via INTRAVENOUS
  Filled 2023-03-30: qty 1

## 2023-03-30 MED ORDER — HYDROCODONE-ACETAMINOPHEN 5-325 MG PO TABS: 1.0000 | ORAL_TABLET | Freq: Once | ORAL | Status: DC
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
## 2023-03-30 NOTE — Discharge Instructions (Addendum)
Your blood work was reassuring.  I suspect that you have a viral illness.  Take Tylenol and Motrin for fever and headache.  Please return the emergency department if your headaches worsening you develop neck pain or any vision change numbness weakness or confusion.

## 2023-03-30 NOTE — ED Provider Notes (Signed)
Patient signed out to me pending reassessment after fluids.  When I reassessed the patient says headache is feeling improved.  He now feels very hot after feeling cold and fever broke.  Looks well and nontoxic.  Discussed Tylenol Motrin at home for fever and bodyaches.  Discussed return precautions.  Low suspicion for meningitis at this time.   Georga Hacking, MD 03/30/23 [phone]

## 2024-06-12 ENCOUNTER — Emergency Department

## 2024-06-12 ENCOUNTER — Other Ambulatory Visit: Payer: Self-pay

## 2024-06-12 ENCOUNTER — Emergency Department: Admission: EM | Admit: 2024-06-12 | Discharge: 2024-06-12 | Disposition: A | Discharge status: Home / Self Care

## 2024-06-12 DIAGNOSIS — M5441 Lumbago with sciatica, right side: Secondary | ICD-10-CM | POA: Insufficient documentation

## 2024-06-12 DIAGNOSIS — E119 Type 2 diabetes mellitus without complications: Secondary | ICD-10-CM | POA: Insufficient documentation

## 2024-06-12 DIAGNOSIS — M5442 Lumbago with sciatica, left side: Secondary | ICD-10-CM | POA: Diagnosis not present

## 2024-06-12 DIAGNOSIS — M549 Dorsalgia, unspecified: Secondary | ICD-10-CM | POA: Diagnosis present

## 2024-06-12 MED ORDER — METHOCARBAMOL 500 MG PO TABS
500.0000 mg | ORAL_TABLET | Freq: Once | ORAL | Status: AC
  Administered 2024-06-12: 500 mg via ORAL
  Filled 2024-06-12: qty 1

## 2024-06-12 MED ORDER — PREDNISONE 20 MG PO TABS
40.0000 mg | ORAL_TABLET | Freq: Once | ORAL | Status: AC
  Administered 2024-06-12: 40 mg via ORAL
  Filled 2024-06-12: qty 2

## 2024-06-12 MED ORDER — METHOCARBAMOL 500 MG PO TABS: 500.0000 mg | ORAL_TABLET | Freq: Four times a day (QID) | ORAL | 0 refills | Status: AC | PRN

## 2024-06-12 MED ORDER — PREDNISONE 20 MG PO TABS
20.0000 mg | ORAL_TABLET | Freq: Two times a day (BID) | ORAL | 0 refills | Status: AC
Start: 2024-06-13 — End: 2024-06-17

## 2024-06-12 NOTE — Discharge Instructions (Addendum)
 You were seen in the emergency department for acute on chronic back pain.  Workup was reassuring today.  Please follow-up with your primary care physician this week.  Please discuss the indications for outpatient physical therapy and outpatient MRI.  Since you are prediabetic, be aware that the steroids may elevate your glucose.  Please monitor your glucose regularly and if greater than 450 please return to the emergency department for evaluation.  Return if any acutely worsening symptoms or any other emergency -- RETURN PRECAUTIONS & AFTERCARE: (ENGLISH) RETURN PRECAUTIONS: Return immediately to the emergency department or see/call your doctor if you feel worse, weak or have changes in speech or vision, are short of breath, have fever, vomiting, pain, bleeding or dark stool, trouble urinating or any new issues. Return here or see/call your doctor if not improving as expected for your suspected condition. FOLLOW-UP CARE: Call your doctor and/or any doctors we referred you to for more advice and to make an appointment. Do this today, tomorrow or after the weekend. Some doctors only take PPO insurance so if you have HMO insurance you may want to contact your HMO or your regular doctor for referral to a specialist within your plan. Either way tell the doctor's office that it was a referral from the emergency department so you get the soonest possible appointment.  YOUR TEST RESULTS: Take result reports of any blood or urine tests, imaging tests and EKG's to your doctor and any referral doctor. Have any abnormal tests repeated. Your doctor or a referral doctor can let you know when this should be done. Also make sure your doctor contacts this hospital to get any test results that are not currently available such as cultures or special tests for infection and final imaging reports, which are often not available at the time you leave the ER but which may list additional important findings that are not documented on  the preliminary report. BLOOD PRESSURE: If your blood pressure was greater than 120/80 have your blood pressure rechecked within 1 to 2 weeks. MEDICATION SIDE EFFECTS: Do not drive, walk, bike, take the bus, etc. if you have received or are being prescribed any sedating medications such as those for pain or anxiety or certain antihistamines like Benadryl . If you have been give one of these here get a taxi home or have a friend drive you home. Ask your pharmacist to counsel you on potential side effects of any new medication

## 2024-06-12 NOTE — ED Triage Notes (Signed)
 Pt comes with lower back pain for over month now. Pt states no known injuries but it is worse after he does a lot of standing and lifting. Pt denies any urinary symptoms.

## 2024-06-12 NOTE — ED Provider Notes (Signed)
 [facility] Provider Note    Event Date/Time   First MD Initiated Contact with Patient 06/12/24 1241     (approximate)   History   Back Pain   HPI  Erik Tyler is a 33 y.o. male with a past medical history of diabetes (not on insulin, well-controlled on Ozempic) who presents with 1.5 months of bilateral back pain that radiates to his bilateral knees.  Patient states that symptoms initiated a month and a half ago while sleeping on a hospital cot while a family member was in the hospital.  For his profession he does do a lot of standing and lifting as well.  He denies any urinary or bowel incontinence, saddle anesthesia, difficulty walking.  He states that pain is next to his spine on both sides.  He denies any fevers or chills cough congestion or shortness of breath.  He does see a primary care physician which he follows with regularly.  He has no history of IV drug use, spinal epidural abscess or recent surgery      Physical Exam   Triage Vital Signs: ED Triage Vitals  Encounter Vitals Group     BP 06/12/24 1231 131/73     Girls Systolic BP Percentile --      Girls Diastolic BP Percentile --      Boys Systolic BP Percentile --      Boys Diastolic BP Percentile --      Pulse Rate 06/12/24 1231 70     Resp 06/12/24 1231 18     Temp 06/12/24 1231 98 F (36.7 C)     Temp src --      SpO2 06/12/24 1231 100 %     Weight 06/12/24 1230 280 lb (127 kg)     Height 06/12/24 1230 5' 9 (1.753 m)     Head Circumference --      Peak Flow --      Pain Score 06/12/24 1230 9     Pain Loc --      Pain Education --      Exclude from Growth Chart --     Most recent vital signs: Vitals:   06/12/24 1231  BP: 131/73  Pulse: 70  Resp: 18  Temp: 98 F (36.7 C)  SpO2: 100%    Nursing Triage Note reviewed. Vital signs reviewed and patients oxygen saturation is normoxic  General: Patient is well nourished, well developed, awake and alert, resting  comfortably in no acute distress Head: Normocephalic and atraumatic Eyes: Normal inspection, extraocular muscles intact, no conjunctival pallor Ear, nose, throat: Normal external exam Neck: Normal range of motion Respiratory: Patient is in no respiratory distress, lungs CTAB Cardiovascular: Patient is not tachycardic, RRR without murmur appreciated GI: Abd SNT with no guarding or rebound  Back: Normal inspection of the back with good strength and range of motion throughout all ext Mild bilateral paraspinal ttp in lumbar region. No flank ttp.  Patient is able to straight leg lift bilaterally Extremities: pulses intact with good cap refills, no LE pitting edema or calf tenderness Neuro: The patient is alert and oriented to person, place, and time, appropriately conversive, with 5/5 bilat UE/LE strength, no gross motor or sensory defects noted. Coordination appears to be adequate.  Ambulates without abnormality Skin: Warm, dry, and intact Psych: normal mood and affect, no SI or HI  ED Results / Procedures / Treatments   Labs (all labs ordered are listed, but only abnormal results are displayed)  Labs Reviewed - No data to display   EKG   RADIOLOGY T-spine XR: No acute abnormality on my independent review interpretation radiologist agrees L-spine XR: No acute abnormality on my independent review and interpretation and radiologist agrees    PROCEDURES:  Critical Care performed: No  Procedures   MEDICATIONS ORDERED IN ED: Medications  predniSONE  (DELTASONE ) tablet 40 mg (40 mg Oral Given 06/12/24 1351)  methocarbamol  (ROBAXIN ) tablet 500 mg (500 mg Oral Given 06/12/24 1351)     IMPRESSION / MDM / ASSESSMENT AND PLAN / ED COURSE                                Differential diagnosis includes, but is not limited to, pathological lumbar fracture, muscle strain, radiculopathy, sciatica, herniated disc   Patient presents with several weeks of lower back pain, atraumatic, afebrile.  Given history and exam, suspect likely musculoskeletal etiology. Nontoxic appearing and no overt risk factors for epidural hematoma or abscess. No overt e/o cauda equina or acute critical cord compression with nonfocal neuro exam. Neurovascularly intact distally. No e/o prostatitis or Fournier's. No peritoneal signs or abdominal pain on exam with low suspicion for AAA.  I reviewed the indications benefits versus alternatives of initiating prednisone  in a patient with diabetes he voiced understanding and opted to proceed and we will monitor his glucose.  Will also send him with prescriptions for methocarbamol .  Is encouraged to follow-up with his primary care physician and discussed the indications for physical therapy referral and possible outpatient MRI.  All questions answered and patient voiced understanding and requested discharge    Clinical Course as of 06/12/24 1827  Sun Jun 12, 2024  1340 DG Lumbar Spine 2-3 Views No acute abnormalities [HD]  1340 DG Thoracic Spine 2 View No acute abnormalities [HD]  1407 I discussed with the patient the results of his x-rays which were unremarkable.  He has been able to ambulate in our department.  He understands the need to check his sugars closely with the prednisone .  He will follow-up with his primary care physician for discussion with outpatient PT and outpatient MRI.  I have also sent prescription for methocarbamol  to his pharmacy of choice [HD]    Clinical Course User Index [HD] Nicholaus Rolland BRAVO, MD   Suggested E/M Coding Level: 4, [id number]  This level has been selected based on the Jul 01, 2022 CPT guidelines for E/M codes in the Emergency Department based on 2/3 of the CoPA, Data, and Risk.  COPA: The patient has an acute illness with systemic symptoms: back pain.   Risk: This patient has a moderate risk of morbidity as evidenced by the following further diagnostic testing or treatment actions: Prescription drug management    FINAL CLINICAL  IMPRESSION(S) / ED DIAGNOSES   Final diagnoses:  Bilateral low back pain with bilateral sciatica, unspecified chronicity     Rx / DC Orders   ED Discharge Orders          Ordered    predniSONE  (DELTASONE ) 20 MG tablet  2 times daily with meals        06/12/24 1358    methocarbamol  (ROBAXIN ) 500 MG tablet  Every 6 hours PRN        06/12/24 1358    Ambulatory Referral to Primary Care (Establish Care)        06/12/24 1359             Note:  This  document was prepared using Conservation officer, historic buildings and may include unintentional dictation errors.   Nicholaus Rolland BRAVO, MD 06/12/24 [phone]
